# Patient Record
Sex: Male | Born: 1960
Health system: Southern US, Community
[De-identification: ages and names within clinical notes are randomized; demographics above are authoritative.]

## PROBLEM LIST (undated history)

## (undated) DIAGNOSIS — R001 Bradycardia, unspecified: Secondary | ICD-10-CM

## (undated) DIAGNOSIS — T7840XA Allergy, unspecified, initial encounter: Secondary | ICD-10-CM

## (undated) DIAGNOSIS — M1990 Unspecified osteoarthritis, unspecified site: Secondary | ICD-10-CM

## (undated) DIAGNOSIS — I10 Essential (primary) hypertension: Secondary | ICD-10-CM

## (undated) DIAGNOSIS — I82409 Acute embolism and thrombosis of unspecified deep veins of unspecified lower extremity: Secondary | ICD-10-CM

## (undated) DIAGNOSIS — E119 Type 2 diabetes mellitus without complications: Secondary | ICD-10-CM

## (undated) DIAGNOSIS — C61 Malignant neoplasm of prostate: Secondary | ICD-10-CM

## (undated) DIAGNOSIS — A219 Tularemia, unspecified: Secondary | ICD-10-CM

## (undated) DIAGNOSIS — R079 Chest pain, unspecified: Secondary | ICD-10-CM

## (undated) HISTORY — PX: ACHILLES TENDON REPAIR: SUR1153

## (undated) HISTORY — DX: Bradycardia, unspecified: R00.1

## (undated) HISTORY — PX: COLONOSCOPY W/ POLYPECTOMY: SHX1380

## (undated) HISTORY — PX: VASECTOMY: SHX75

## (undated) HISTORY — DX: Type 2 diabetes mellitus without complications: E11.9

## (undated) HISTORY — DX: Chest pain, unspecified: R07.9

## (undated) HISTORY — PX: PROSTATE BIOPSY: SHX241

## (undated) HISTORY — DX: Essential (primary) hypertension: I10

## (undated) HISTORY — DX: Allergy, unspecified, initial encounter: T78.40XA

## (undated) HISTORY — DX: Unspecified osteoarthritis, unspecified site: M19.90

## (undated) HISTORY — PX: CIRCUMCISION: SUR203

---

## 2012-11-12 ENCOUNTER — Encounter (HOSPITAL_BASED_OUTPATIENT_CLINIC_OR_DEPARTMENT_OTHER): Payer: Self-pay | Admitting: Emergency Medicine

## 2012-11-12 ENCOUNTER — Observation Stay (HOSPITAL_BASED_OUTPATIENT_CLINIC_OR_DEPARTMENT_OTHER)
Admission: EM | Admit: 2012-11-12 | Discharge: 2012-11-13 | Disposition: A | Discharge status: Home / Self Care | Payer: 59 | Attending: Internal Medicine | Admitting: Internal Medicine

## 2012-11-12 ENCOUNTER — Emergency Department (HOSPITAL_BASED_OUTPATIENT_CLINIC_OR_DEPARTMENT_OTHER): Payer: 59

## 2012-11-12 DIAGNOSIS — R0609 Other forms of dyspnea: Secondary | ICD-10-CM | POA: Insufficient documentation

## 2012-11-12 DIAGNOSIS — R079 Chest pain, unspecified: Secondary | ICD-10-CM | POA: Diagnosis present

## 2012-11-12 DIAGNOSIS — I10 Essential (primary) hypertension: Secondary | ICD-10-CM | POA: Insufficient documentation

## 2012-11-12 DIAGNOSIS — R001 Bradycardia, unspecified: Secondary | ICD-10-CM | POA: Diagnosis present

## 2012-11-12 DIAGNOSIS — I498 Other specified cardiac arrhythmias: Secondary | ICD-10-CM | POA: Insufficient documentation

## 2012-11-12 DIAGNOSIS — R0789 Other chest pain: Principal | ICD-10-CM | POA: Insufficient documentation

## 2012-11-12 DIAGNOSIS — R0989 Other specified symptoms and signs involving the circulatory and respiratory systems: Secondary | ICD-10-CM | POA: Insufficient documentation

## 2012-11-12 HISTORY — DX: Acute embolism and thrombosis of unspecified deep veins of unspecified lower extremity: I82.409

## 2012-11-12 HISTORY — DX: Tularemia, unspecified: A21.9

## 2012-11-12 LAB — CBC WITH DIFFERENTIAL/PLATELET
Eosinophils Absolute: 0.3 10*3/uL (ref 0.0–0.7)
Hemoglobin: 14.6 g/dL (ref 13.0–17.0)
Lymphocytes Relative: 39 % (ref 12–46)
Lymphs Abs: 2.9 10*3/uL (ref 0.7–4.0)
MCH: 29 pg (ref 26.0–34.0)
Monocytes Relative: 11 % (ref 3–12)
Neutrophils Relative %: 46 % (ref 43–77)
RBC: 5.04 MIL/uL (ref 4.22–5.81)
WBC: 7.6 10*3/uL (ref 4.0–10.5)

## 2012-11-12 LAB — COMPREHENSIVE METABOLIC PANEL
ALT: 31 U/L (ref 0–53)
Alkaline Phosphatase: 97 U/L (ref 39–117)
BUN: 16 mg/dL (ref 6–23)
CO2: 23 mEq/L (ref 19–32)
Chloride: 105 mEq/L (ref 96–112)
GFR calc Af Amer: 73 mL/min — ABNORMAL LOW (ref >90)
GFR calc non Af Amer: 63 mL/min — ABNORMAL LOW (ref >90)
Glucose, Bld: 103 mg/dL — ABNORMAL HIGH (ref 70–99)
Potassium: 3.8 mEq/L (ref 3.5–5.1)
Total Bilirubin: 0.3 mg/dL (ref 0.3–1.2)
Total Protein: 7.4 g/dL (ref 6.0–8.3)

## 2012-11-12 LAB — TROPONIN I: Troponin I: <0.3 ng/mL (ref <0.30)

## 2012-11-12 MED ORDER — KETOROLAC TROMETHAMINE 30 MG/ML IJ SOLN
30.0000 mg | Freq: Once | INTRAMUSCULAR | Status: AC
  Administered 2012-11-12: 30 mg via INTRAVENOUS
  Filled 2012-11-12: qty 1

## 2012-11-12 NOTE — ED Notes (Signed)
MD at bedside. 

## 2012-11-12 NOTE — ED Notes (Signed)
Pt having intermittent chest pain, some nausea, some lightheadedness, lethargy, sob on exertion for about 10 days.  Currently pain in at right side of chest.

## 2012-11-12 NOTE — ED Provider Notes (Signed)
History     CSN: [id number]  Arrival date & time 11/12/12  2248   First MD Initiated Contact with Patient 11/12/12 2311      Chief Complaint  Patient presents with  . Chest Pain    (Consider location/radiation/quality/duration/timing/severity/associated sxs/prior treatment) Patient is a 52 y.o. male presenting with chest pain. The history is provided by the patient.  Chest Pain Pain location:  L chest and R chest Pain quality: dull and tightness   Pain radiates to:  Does not radiate Pain radiates to the back: no   Pain severity:  Moderate Onset quality:  Gradual Timing:  Constant Progression:  Waxing and waning Chronicity:  New Context: not at rest   Relieved by:  Nothing Worsened by:  Nothing tried Ineffective treatments:  None tried Associated symptoms: cough, diaphoresis and nausea   Risk factors: no smoking     Past Medical History  Diagnosis Date  . [place] fever     No past surgical history on file.  No family history on file.  History  Substance Use Topics  . Smoking status: Never Smoker   . Smokeless tobacco: Not on file  . Alcohol Use: Not on file      Review of Systems  Constitutional: Positive for diaphoresis.  Respiratory: Positive for cough and wheezing.   Cardiovascular: Positive for chest pain.  Gastrointestinal: Positive for nausea.  All other systems reviewed and are negative.    Allergies  Review of patient's allergies indicates no known allergies.  Home Medications  No current outpatient prescriptions on file.  BP 137/86  Pulse 68  Temp(Src) 98.3 F (36.8 C) (Oral)  Resp 18  Ht 5\' 10"  (1.778 m)  Wt 245 lb (111.131 kg)  BMI 35.15 kg/m2  SpO2 97%  Physical Exam  Constitutional: He is oriented to person, place, and time. He appears well-developed and well-nourished. No distress.  HENT:  Head: Normocephalic and atraumatic.  Mouth/Throat: Oropharynx is clear and moist.  Eyes: Conjunctivae are normal. Pupils are  equal, round, and reactive to light.  Neck: Normal range of motion. Neck supple.  Cardiovascular: Normal rate, regular rhythm and intact distal pulses.   Pulmonary/Chest: Effort normal and breath sounds normal.  Abdominal: Soft. Bowel sounds are normal. There is no tenderness. There is no rebound and no guarding.  Musculoskeletal: Normal range of motion.  Neurological: He is alert and oriented to person, place, and time.  Skin: Skin is warm and dry.  Psychiatric: He has a normal mood and affect.    ED Course  Procedures (including critical care time)  Labs Reviewed  CBC WITH DIFFERENTIAL  COMPREHENSIVE METABOLIC PANEL  TROPONIN I   No results found.   No diagnosis found.    MDM   Date: 11/12/2012  Rate: 69  Rhythm: normal sinus rhythm  QRS Axis: normal  Intervals: normal  ST/T Wave abnormalities: normal  Conduction Disutrbances: none  Narrative Interpretation: unremarkable    .        Jasmine Awe, MD 11/13/12 [phone]

## 2012-11-13 ENCOUNTER — Encounter (HOSPITAL_COMMUNITY): Payer: Self-pay | Admitting: *Deleted

## 2012-11-13 ENCOUNTER — Other Ambulatory Visit: Payer: Self-pay | Admitting: Physician Assistant

## 2012-11-13 DIAGNOSIS — I498 Other specified cardiac arrhythmias: Secondary | ICD-10-CM

## 2012-11-13 DIAGNOSIS — R079 Chest pain, unspecified: Secondary | ICD-10-CM

## 2012-11-13 DIAGNOSIS — R001 Bradycardia, unspecified: Secondary | ICD-10-CM | POA: Diagnosis present

## 2012-11-13 LAB — CBC
HCT: 39.2 % (ref 39.0–52.0)
MCV: 81.5 fL (ref 78.0–100.0)
RBC: 4.81 MIL/uL (ref 4.22–5.81)
WBC: 6.4 10*3/uL (ref 4.0–10.5)

## 2012-11-13 LAB — TROPONIN I
Troponin I: <0.3 ng/mL (ref <0.30)
Troponin I: <0.3 ng/mL (ref <0.30)

## 2012-11-13 LAB — LIPID PANEL
Cholesterol: 143 mg/dL (ref 0–200)
Triglycerides: 108 mg/dL (ref <150)

## 2012-11-13 LAB — CREATININE, SERUM: GFR calc Af Amer: 74 mL/min — ABNORMAL LOW (ref >90)

## 2012-11-13 MED ORDER — ALBUTEROL SULFATE (5 MG/ML) 0.5% IN NEBU: 2.5000 mg | INHALATION_SOLUTION | RESPIRATORY_TRACT | Status: DC | PRN

## 2012-11-13 MED ORDER — SODIUM CHLORIDE 0.9 % IJ SOLN: 3.0000 mL | INTRAMUSCULAR | Status: DC | PRN

## 2012-11-13 MED ORDER — ACETAMINOPHEN 325 MG PO TABS: 650.0000 mg | ORAL_TABLET | Freq: Four times a day (QID) | ORAL | Status: DC | PRN

## 2012-11-13 MED ORDER — GUAIFENESIN ER 600 MG PO TB12
600.0000 mg | ORAL_TABLET | Freq: Two times a day (BID) | ORAL | Status: DC
Start: 2012-11-13 — End: 2012-11-13
  Filled 2012-11-13 (×2): qty 1

## 2012-11-13 MED ORDER — SODIUM CHLORIDE 0.9 % IV SOLN: 250.0000 mL | INTRAVENOUS | Status: DC | PRN

## 2012-11-13 MED ORDER — MORPHINE SULFATE 2 MG/ML IJ SOLN: 2.0000 mg | INTRAMUSCULAR | Status: DC | PRN

## 2012-11-13 MED ORDER — AMLODIPINE BESYLATE 2.5 MG PO TABS: 2.5000 mg | ORAL_TABLET | Freq: Every day | ORAL | Status: DC

## 2012-11-13 MED ORDER — DOCUSATE SODIUM 100 MG PO CAPS
100.0000 mg | ORAL_CAPSULE | Freq: Two times a day (BID) | ORAL | Status: DC
  Filled 2012-11-13 (×2): qty 1

## 2012-11-13 MED ORDER — HYDROCODONE-ACETAMINOPHEN 5-325 MG PO TABS: 1.0000 | ORAL_TABLET | ORAL | Status: DC | PRN

## 2012-11-13 MED ORDER — FENTANYL CITRATE 0.05 MG/ML IJ SOLN
100.0000 ug | Freq: Once | INTRAMUSCULAR | Status: AC
  Administered 2012-11-13: 100 ug via INTRAVENOUS
  Filled 2012-11-13: qty 2

## 2012-11-13 MED ORDER — ENOXAPARIN SODIUM 40 MG/0.4ML ~~LOC~~ SOLN
40.0000 mg | SUBCUTANEOUS | Status: DC
  Filled 2012-11-13: qty 0.4

## 2012-11-13 MED ORDER — ONDANSETRON HCL 4 MG PO TABS: 4.0000 mg | ORAL_TABLET | Freq: Four times a day (QID) | ORAL | Status: DC | PRN

## 2012-11-13 MED ORDER — GI COCKTAIL ~~LOC~~
ORAL | Status: AC
  Administered 2012-11-13: 30 mL via ORAL
  Filled 2012-11-13: qty 30

## 2012-11-13 MED ORDER — ALBUTEROL SULFATE HFA 108 (90 BASE) MCG/ACT IN AERS: 2.0000 | INHALATION_SPRAY | Freq: Four times a day (QID) | RESPIRATORY_TRACT | Status: DC | PRN

## 2012-11-13 MED ORDER — GI COCKTAIL ~~LOC~~: 30.0000 mL | Freq: Once | ORAL | Status: AC

## 2012-11-13 MED ORDER — ASPIRIN 81 MG PO TBEC: 81.0000 mg | DELAYED_RELEASE_TABLET | Freq: Every day | ORAL | Status: DC

## 2012-11-13 MED ORDER — AMLODIPINE BESYLATE 2.5 MG PO TABS
2.5000 mg | ORAL_TABLET | Freq: Every day | ORAL | Status: DC
  Filled 2012-11-13: qty 1

## 2012-11-13 MED ORDER — SODIUM CHLORIDE 0.9 % IJ SOLN
3.0000 mL | Freq: Two times a day (BID) | INTRAMUSCULAR | Status: DC
Start: 2012-11-13 — End: 2012-11-13

## 2012-11-13 MED ORDER — SODIUM CHLORIDE 0.9 % IJ SOLN: 3.0000 mL | Freq: Two times a day (BID) | INTRAMUSCULAR | Status: DC

## 2012-11-13 MED ORDER — ACETAMINOPHEN 650 MG RE SUPP: 650.0000 mg | Freq: Four times a day (QID) | RECTAL | Status: DC | PRN

## 2012-11-13 MED ORDER — ASPIRIN EC 81 MG PO TBEC
81.0000 mg | DELAYED_RELEASE_TABLET | Freq: Every day | ORAL | Status: DC
Start: 2012-11-13 — End: 2012-11-13
  Filled 2012-11-13: qty 1

## 2012-11-13 MED ORDER — ONDANSETRON HCL 4 MG/2ML IJ SOLN: 4.0000 mg | Freq: Four times a day (QID) | INTRAMUSCULAR | Status: DC | PRN

## 2012-11-13 MED ORDER — GUAIFENESIN ER 600 MG PO TB12: 600.0000 mg | ORAL_TABLET | Freq: Two times a day (BID) | ORAL | Status: DC

## 2012-11-13 NOTE — Progress Notes (Signed)
Patient ID: Adam Estrada  male  EAV:[id number]    DOB: Mar 09, 1961    DOA: 11/12/2012  PCP: No primary provider on file.  Assessment/Plan:    Chest pain: With typical and atypical features -No other risk factors, HEART score 2, troponins negative, EKG showed no acute ST-T wave changes, no prior cardiac workup - Discussed with the Dr. Antoine Poche from [facility] cardiology, recommended exercise tolerance test    Bradycardia - Currently stable not on any beta blockers  DVT Prophylaxis:  Code Status:  Disposition: if exercise tolerance test is negative, patient can be DC'd home and obtain outpatient 2-D echocardiogram at [facility] cardiology office    Subjective: Chest pain resolved, no dizziness or lightheadedness  Objective: Weight change:   Intake/Output Summary (Last 24 hours) at 11/13/12 1303 Last data filed at 11/13/12 0515  Gross per 24 hour  Intake      0 ml  Output    250 ml  Net   -250 ml   Blood pressure 103/59, pulse 46, temperature 97.7 F (36.5 C), temperature source Oral, resp. rate 18, height 5\' 10"  (1.778 m), weight 111.131 kg (245 lb), SpO2 100.00%.  Physical Exam: General: Alert and awake, oriented x3, not in any acute distress. HEENT: anicteric sclera, PERLA, EOMI CVS: S1-S2 clear, no murmur rubs or gallops Chest: clear to auscultation bilaterally, no wheezing, rales or rhonchi Abdomen: soft nontender, nondistended, normal bowel sounds  Extremities: no cyanosis, clubbing or edema noted bilaterally Neuro: Cranial nerves II-XII intact, no focal neurological deficits  Lab Results: Basic Metabolic Panel:  Recent Labs Lab 11/12/12 2313 11/13/12 0657  NA 139  --   K 3.8  --   CL 105  --   CO2 23  --   GLUCOSE 103*  --   BUN 16  --   CREATININE 1.30 1.28  CALCIUM 9.7  --    Liver Function Tests:  Recent Labs Lab 11/12/12 2313  AST 21  ALT 31  ALKPHOS 97  BILITOT 0.3  PROT 7.4  ALBUMIN 3.8   No results found for this basename: LIPASE, AMYLASE,   in the last 168 hours No results found for this basename: AMMONIA,  in the last 168 hours CBC:  Recent Labs Lab 11/12/12 2313 11/13/12 0657  WBC 7.6 6.4  NEUTROABS 3.5  --   HGB 14.6 13.9  HCT 40.8 39.2  MCV 81.0 81.5  PLT 244 212   Cardiac Enzymes:  Recent Labs Lab 11/12/12 2313 11/13/12 0657 11/13/12 1140  TROPONINI <0.30 <0.30 <0.30   BNP: No components found with this basename: POCBNP,  CBG: No results found for this basename: GLUCAP,  in the last 168 hours   Micro Results: No results found for this or any previous visit (from the past 240 hour(s)).  Studies/Results: Dg Chest 2 View  11/13/2012   *RADIOLOGY REPORT*  Clinical Data: Chest pain and shortness of breath with exertion  CHEST - 2 VIEW  Comparison: None.  Findings: Patient is slightly rotated to the left, accentuating the right hilar contour.  Normal heart size. Normal pulmonary vascularity.  The lungs are clear aside from minimal linear left atelectasis or scarring at the lateral left lung base.  Bones unremarkable.  IMPRESSION: No acute cardiopulmonary disease.  Lungs clear except for minimal left basilar atelectasis or scar.   Original Report Authenticated By: Britta Mccreedy, M.D.    Medications: Scheduled Meds: . aspirin EC  81 mg Oral Daily  . docusate sodium  100 mg Oral BID  .  enoxaparin (LOVENOX) injection  40 mg Subcutaneous Q24H  . guaiFENesin  600 mg Oral BID  . sodium chloride  3 mL Intravenous Q12H  . sodium chloride  3 mL Intravenous Q12H      LOS: 1 day   RAI,RIPUDEEP M.D. [facility] 11/13/2012, 1:03 PM Pager: [phone]  If 7PM-7AM, please contact night-coverage [email or web address] Password [email or web address]

## 2012-11-13 NOTE — H&P (Signed)
PCP: none   Chief Complaint:  Chest pain  HPI: Adam Estrada is a 52 y.o. male   has a past medical history of [place] fever and DVT (deep venous thrombosis).   Presented with  Patient reports feeling fatigued with off and on sensation of tightness in his chest and some pain. It seems to be worse with exertion. He occasionally gets short of breath. He walks a lot for work and when he does he feel uncomfortable. Never had a stress test done. The tightness in his chest seems to be constant for the past 1 week and does not go away. He has occasional painful sensation that comes and goes and can radiate to the back.  patient presented to [facility] and was transferred to [facility] for further work up. Of note patient is Jehovah's witness and does not wish to receive any blood products.   Review of Systems:   Pertinent positives include: chest pain, non-productive cough, nausea,   Constitutional:  No weight loss, night sweats, Fevers, chills, fatigue, weight loss  HEENT:  No headaches, Difficulty swallowing,Tooth/dental problems,Sore throat,  No sneezing, itching, ear ache, nasal congestion, post nasal drip,  Cardio-vascular:  No , Orthopnea, PND, anasarca, dizziness, palpitations.no Bilateral lower extremity swelling  GI:  No heartburn, indigestion, abdominal pain, vomiting, diarrhea, change in bowel habits, loss of appetite, melena, blood in stool, hematemesis Resp:  no shortness of breath at rest. No dyspnea on exertion, No excess mucus, no productive cough, No No coughing up of blood.No change in color of mucus.No wheezing. Skin:  no rash or lesions. No jaundice GU:  no dysuria, change in color of urine, no urgency or frequency. No straining to urinate.  No flank pain.  Musculoskeletal:  No joint pain or no joint swelling. No decreased range of motion. No back pain.  Psych:  No change in mood or affect. No depression or anxiety. No memory loss.  Neuro: no localizing neurological  complaints, no tingling, no weakness, no double vision, no gait abnormality, no slurred speech, no confusion  Otherwise ROS are negative except for above, 10 systems were reviewed  Past Medical History: Past Medical History  Diagnosis Date  . [place] fever   . DVT (deep venous thrombosis)    Past Surgical History  Procedure Laterality Date  . Achilles tendon repair       Medications: Prior to Admission medications   Not on File    Allergies:  No Known Allergies  Social History:  Ambulatory   independently   Lives at   Home with family   reports that he has never smoked. He does not have any smokeless tobacco history on file. He reports that he does not drink alcohol or use illicit drugs.   Family History: family history includes Breast cancer in his maternal aunt; Heart disease in his maternal aunt and other; and Pneumonia in his father.    Physical Exam: Patient Vitals for the past 24 hrs:  BP Temp Temp src Pulse Resp SpO2 Height Weight  11/13/12 0500 103/59 mmHg 97.7 F (36.5 C) Oral 46 18 100 % - -  11/13/12 0334 114/63 mmHg - - 62 18 100 % - -  11/13/12 0125 110/62 mmHg - - 58 18 100 % - -  11/12/12 2255 137/86 mmHg 98.3 F (36.8 C) Oral 68 18 97 % 5\' 10"  (1.778 m) 111.131 kg (245 lb)    1. General:  in No Acute distress 2. Psychological: Alert and Oriented 3. Head/ENT:  Moist   Mucous Membranes                          Head Non traumatic, neck supple                          Normal   Dentition 4. SKIN: normal   Skin turgor,  Skin clean Dry and intact no rash 5. Heart: Regular rate and rhythm no Murmur, Rub or gallop 6. Lungs: Clear to auscultation bilaterally, no wheezes or crackles   7. Abdomen: Soft, non-tender, Non distended 8. Lower extremities: no clubbing, cyanosis, or edema 9. Neurologically Grossly intact, moving all 4 extremities equally 10. MSK: Normal range of motion  body mass index is 35.15 kg/(m^2).   Labs on Admission:    Recent Labs  11/12/12 2313  NA 139  K 3.8  CL 105  CO2 23  GLUCOSE 103*  BUN 16  CREATININE 1.30  CALCIUM 9.7    Recent Labs  11/12/12 2313  AST 21  ALT 31  ALKPHOS 97  BILITOT 0.3  PROT 7.4  ALBUMIN 3.8   No results found for this basename: LIPASE, AMYLASE,  in the last 72 hours  Recent Labs  11/12/12 2313  WBC 7.6  NEUTROABS 3.5  HGB 14.6  HCT 40.8  MCV 81.0  PLT 244    Recent Labs  11/12/12 2313  TROPONINI <0.30   No results found for this basename: TSH, T4TOTAL, FREET3, T3FREE, THYROIDAB,  in the last 72 hours No results found for this basename: VITAMINB12, FOLATE, FERRITIN, TIBC, IRON, RETICCTPCT,  in the last 72 hours No results found for this basename: HGBA1C    Estimated Creatinine Clearance: 85.8 ml/min (by C-G formula based on Cr of 1.3). ABG No results found for this basename: phart, pco2, po2, hco3, tco2, acidbasedef, o2sat     Lab Results  Component Value Date   DDIMER <0.27 11/12/2012     Other results:  I have pearsonaly reviewed this: ECG REPORT  Rate: 69  Rhythm: NSR ST&T Change: V2 changes likely due to early repolarization  Cultures: No results found for this basename: sdes, specrequest, cult, reptstatus       Radiological Exams on Admission: Dg Chest 2 View  11/13/2012   *RADIOLOGY REPORT*  Clinical Data: Chest pain and shortness of breath with exertion  CHEST - 2 VIEW  Comparison: None.  Findings: Patient is slightly rotated to the left, accentuating the right hilar contour.  Normal heart size. Normal pulmonary vascularity.  The lungs are clear aside from minimal linear left atelectasis or scarring at the lateral left lung base.  Bones unremarkable.  IMPRESSION: No acute cardiopulmonary disease.  Lungs clear except for minimal left basilar atelectasis or scar.   Original Report Authenticated By: Britta Mccreedy, M.D.    Chart has been reviewed  Assessment/Plan   52 yo M w no prior cardiac hx here with atypical chest  pain but somewhat worrisome with exertional component  Present on Admission:  . Chest pain - atypical but given risk factors will admit, monitor on telemetry, cycle cardiac enzymes, obtain serial ECG. Further risk stratify with lipid panel, hgA1C, obtain TSH. Make sure patient is on Aspirin. Further treatment based on the currently pending results. Will obtain echo to evaluate for wall motion abnormality and or hypertrophy . Bradycardia - check TSH, asymtomatic   Prophylaxis: Lovenox   CODE STATUS: FULL CODE  Other plan  as per orders.  I have spent a total of 55 min on this admission  Mande Auvil 11/13/2012, 5:49 AM

## 2012-11-13 NOTE — Discharge Summary (Signed)
Physician Discharge Summary  Patient ID: Adam Estrada MRN: [id number] DOB/AGE: 07/03/1960 52 y.o.  Admit date: 11/12/2012 Discharge date: 11/13/2012  Primary Care Physician:  No primary provider on file.  Discharge Diagnoses:    . Chest pain . Bradycardia   Dyspnea on exertion  Consults: [facility] cardiology   Recommendations for Outpatient Follow-up:  - Outpatient 2-D echo was scheduled for tomorrow 11/14/2012 at 4 PM - Followup cardiology appt 01/07/13 at 10:15am with Dr. Swaziland  - Patient was also provided with resources/phone number to call for obtaining primary care physician by the case manager   Allergies:  No Known Allergies   Discharge Medications:   Medication List    TAKE these medications       albuterol 108 (90 BASE) MCG/ACT inhaler  Commonly known as:  PROVENTIL HFA;VENTOLIN HFA  Inhale 2 puffs into the lungs every 6 (six) hours as needed for wheezing.     amLODipine 2.5 MG tablet  Commonly known as:  NORVASC  Take 1 tablet (2.5 mg total) by mouth daily.     aspirin 81 MG EC tablet  Take 1 tablet (81 mg total) by mouth daily.     guaiFENesin 600 MG 12 hr tablet  Commonly known as:  MUCINEX  Take 1 tablet (600 mg total) by mouth 2 (two) times daily.         Brief H and P: For complete details please refer to admission H and P, but in brief 52 year old male with history of DVT, reported feeling fatigued with off and on sensation of tightness in his chest and some pain. It seems to be worse with exertion. He occasionally gets short of breath. He walks a lot for work and when he does he feel uncomfortable. Never had a stress test done. The tightness in his chest seems to be constant for the past 1 week and does not go away. He has occasional painful sensation that comes and goes and can radiate to the back.  patient presented to [facility] and was transferred to [facility] for further work up.   Hospital Course:   Chest pain: With typical and atypical features. No  other risk factors, HEART score 2, troponins negative, EKG showed no acute ST-T wave changes, no prior cardiac workup. Discussed with the Dr. Antoine Poche from [facility] cardiology. Patient underwent exercise tolerance test.  Per cardiology,  Exercised 8:50, reached target HR of 146, max 148. Hypertensive response to exercise with BP up to 231/82 thus test terminated. SOB present at very end of test but overall very good exercise tolerance. In retrospect patient calls mild chest tightness (not pain) towards end of test, but did not report this upon questioning during the test. Tracings read by Dr. Patty Sermons - Baseline wander present but no acute ischemic changes, essentially a normal study.  Cardiology recommended starting patient on low dose of Norvasc for exertional hypertension. Outpatient 2-D echo was arranged for tomorrow 11/14/2012 at [facility] cardiology office. Patient will followup with Dr. Swaziland with his outpatient appointment.  Resting Sinus Bradycardia - Currently stable not on any beta blockers, no AV blocks on the EKG.   Day of Discharge BP 103/59  Pulse 46  Temp(Src) 97.7 F (36.5 C) (Oral)  Resp 18  Ht 5\' 10"  (1.778 m)  Wt 111.131 kg (245 lb)  BMI 35.15 kg/m2  SpO2 100%  Physical Exam: General: Alert and awake oriented x3 not in any acute distress. HEENT: anicteric sclera, pupils reactive to light and accommodation CVS: S1-S2 clear no  murmur rubs or gallops Chest: clear to auscultation bilaterally, no wheezing rales or rhonchi Abdomen: soft nontender, nondistended, normal bowel sounds, no organomegaly Extremities: no cyanosis, clubbing or edema noted bilaterally Neuro: Cranial nerves II-XII intact, no focal neurological deficits   The results of significant diagnostics from this hospitalization (including imaging, microbiology, ancillary and laboratory) are listed below for reference.    LAB RESULTS: Basic Metabolic Panel:  Recent Labs Lab 11/12/12 2313 11/13/12 0657   NA 139  --   K 3.8  --   CL 105  --   CO2 23  --   GLUCOSE 103*  --   BUN 16  --   CREATININE 1.30 1.28  CALCIUM 9.7  --    Liver Function Tests:  Recent Labs Lab 11/12/12 2313  AST 21  ALT 31  ALKPHOS 97  BILITOT 0.3  PROT 7.4  ALBUMIN 3.8   No results found for this basename: LIPASE, AMYLASE,  in the last 168 hours No results found for this basename: AMMONIA,  in the last 168 hours CBC:  Recent Labs Lab 11/12/12 2313 11/13/12 0657  WBC 7.6 6.4  NEUTROABS 3.5  --   HGB 14.6 13.9  HCT 40.8 39.2  MCV 81.0 81.5  PLT 244 212   Cardiac Enzymes:  Recent Labs Lab 11/13/12 0657 11/13/12 1140  TROPONINI <0.30 <0.30   BNP: No components found with this basename: POCBNP,  CBG: No results found for this basename: GLUCAP,  in the last 168 hours  Significant Diagnostic Studies:  Dg Chest 2 View  11/13/2012   *RADIOLOGY REPORT*  Clinical Data: Chest pain and shortness of breath with exertion  CHEST - 2 VIEW  Comparison: None.  Findings: Patient is slightly rotated to the left, accentuating the right hilar contour.  Normal heart size. Normal pulmonary vascularity.  The lungs are clear aside from minimal linear left atelectasis or scarring at the lateral left lung base.  Bones unremarkable.  IMPRESSION: No acute cardiopulmonary disease.  Lungs clear except for minimal left basilar atelectasis or scar.   Original Report Authenticated By: Britta Mccreedy, M.D.    Disposition and Follow-up:     Discharge Orders   Future Appointments Provider Department Dept Phone   11/14/2012 4:00 PM [facility] Echo [facility] SITE 3 ECHO LAB [phone]   01/07/2013 10:15 AM Peter M Swaziland, MD [facility] Main Office [place]) [phone]   Future Orders Complete By Expires     Diet - low sodium heart healthy  As directed     Increase activity slowly  As directed         DISPOSITION: Home   DIET: Heart healthy diet ACTIVITY: As tolerated   DISCHARGE  FOLLOW-UP Follow-up Information   Follow up with [facility]. (Heart ultrasound tomorrow 11/14/12 at 4pm)    Contact information:   [address] [place] [address] [phone]      Follow up with Peter Swaziland, MD. (01/07/13 at 10:15am - you were also put on a waiting list for a sooner appointment. Return to the ER for any symptoms concerning to you.)    Contact information:   [address] [place] [address] [phone] [facility]      Time spent on Discharge: 35 mins  Signed:   Salem Lembke M.D. [facility] 11/13/2012, 1:39 PM Pager: [phone]

## 2012-11-13 NOTE — Care Management (Signed)
Care Manager provided pt with the [facility] Number for local PCP. No further needs from CM at this time. Gala Lewandowsky, RN,BSN [phone]

## 2012-11-13 NOTE — Progress Notes (Signed)
ETT completed. Exercised 8:50, reached target HR of 146, max 148.  Hypertensive response to exercise with BP up to 231/82 thus test terminated. SOB present at very end of test but overall very good exercise tolerance. In retrospect patient calls mild chest tightness (not pain) towards end of test, but did not report this upon questioning during the test. Tracings read by Dr. Patty Sermons - Baseline wander present but no acute ischemic changes, essentially a normal study. Per our discussion, might consider low dose antihypertensive given hypertensive response (does not have resting hypertension). Per discussion with Dr. Isidoro Donning, will set up appt for echo (tomorrow 6/20 at 4pm) and f/u in our office (next available new pt appointment 01/07/13 at 10:15am with Dr. Swaziland but is on a waiting list to be moved up to a sooner date). He should be given return-to-ER precautions at discharge.  Dayna Dunn PA-C

## 2012-11-14 ENCOUNTER — Ambulatory Visit (HOSPITAL_COMMUNITY): Payer: 59 | Attending: Cardiology | Admitting: Radiology

## 2012-11-14 DIAGNOSIS — I495 Sick sinus syndrome: Secondary | ICD-10-CM | POA: Insufficient documentation

## 2012-11-14 DIAGNOSIS — R079 Chest pain, unspecified: Secondary | ICD-10-CM | POA: Insufficient documentation

## 2012-11-14 DIAGNOSIS — R072 Precordial pain: Secondary | ICD-10-CM | POA: Insufficient documentation

## 2012-11-14 NOTE — Progress Notes (Signed)
Echocardiogram performed.  

## 2013-01-07 ENCOUNTER — Ambulatory Visit (INDEPENDENT_AMBULATORY_CARE_PROVIDER_SITE_OTHER): Payer: 59 | Admitting: Cardiology

## 2013-01-07 ENCOUNTER — Encounter: Payer: Self-pay | Admitting: Cardiology

## 2013-01-07 VITALS — BP 132/84 | HR 74 | Ht 70.0 in | Wt 267.8 lb

## 2013-01-07 DIAGNOSIS — R079 Chest pain, unspecified: Secondary | ICD-10-CM

## 2013-01-07 DIAGNOSIS — I498 Other specified cardiac arrhythmias: Secondary | ICD-10-CM

## 2013-01-07 DIAGNOSIS — R001 Bradycardia, unspecified: Secondary | ICD-10-CM

## 2013-01-07 NOTE — Progress Notes (Signed)
   Adam Estrada Date of Birth: [birth year] Medical Record #[id number]  History of Present Illness: Adam Estrada is seen for followup today following hospitalization in June for her chest pain. He ruled out for myocardial infarction. A stress Myoview was performed. He was able to complete stage III of the Bruce protocol. He had no significant ST changes and his Myoview study was normal. He did have a hypertensive blood pressure response to exercise. Echocardiogram was normal. He was discharged on amlodipine but states he only took it for 2 days and then stopped it. He has been feeling well. He denies any recurrent chest pain. His exercise includes playing basketball one day a week. He is in the process of finding a primary care doctor.  No current outpatient prescriptions on file prior to visit.   No current facility-administered medications on file prior to visit.    No Known Allergies  Past Medical History  Diagnosis Date  . [place] fever   . DVT (deep venous thrombosis)   . Chest pain   . HTN (hypertension)   . Bradycardia     Past Surgical History  Procedure Laterality Date  . Achilles tendon repair      History  Smoking status  . Never Smoker   Smokeless tobacco  . Not on file    History  Alcohol Use No    Family History  Problem Relation Age of Onset  . Pneumonia Father   . Heart disease Other   . Heart disease Maternal Aunt   . Breast cancer Maternal Aunt     Review of Systems: As noted in history of present illnesse negative.  Physical Exam: BP 132/84  Pulse 74  Ht 5\' 10"  (1.778 m)  Wt 267 lb 12.8 oz (121.473 kg)  BMI 38.43 kg/m2  SpO2 99%  he is an overweight black male in no acute distress. HEENT: Normal, no JVD, adenopathy, or bruits. Lungs: Clear Cardiovascular: Regular rate and rhythm, normal S1 and S2. No gallop or murmur. Abdomen: Obese, soft, nontender. Bowel sounds are positive. Extremities: No cyanosis or edema. Pulses are 2+ and  symmetric. Neuro: Alert and oriented x3. Cranial nerves are intact.  LABORATORY DATA:   Assessment / Plan:  1. Atypical chest pain. Cardiac evaluation is unremarkable. I recommended lifestyle modifications including weight loss, regular aerobic exercise, and a heart healthy diet. His blood pressure is normal. I do not recommend long-term antihypertensive therapy at this point. I will followup as needed.

## 2013-01-07 NOTE — Patient Instructions (Signed)
Increase your aerobic activity, eat a healthy diet, and lose weight.  I will see you as needed.

## 2014-01-28 ENCOUNTER — Ambulatory Visit: Payer: 59 | Admitting: Medical

## 2014-05-17 ENCOUNTER — Ambulatory Visit (INDEPENDENT_AMBULATORY_CARE_PROVIDER_SITE_OTHER): Payer: 59 | Admitting: Family Medicine

## 2014-05-17 ENCOUNTER — Encounter: Payer: Self-pay | Admitting: Family Medicine

## 2014-05-17 VITALS — BP 130/68 | HR 75 | Temp 98.3°F | Resp 16 | Ht 70.0 in | Wt 264.8 lb

## 2014-05-17 DIAGNOSIS — Z131 Encounter for screening for diabetes mellitus: Secondary | ICD-10-CM

## 2014-05-17 DIAGNOSIS — Z130 Encounter for screening for diseases of the blood and blood-forming organs and certain disorders involving the immune mechanism: Secondary | ICD-10-CM

## 2014-05-17 DIAGNOSIS — Z0000 Encounter for general adult medical examination without abnormal findings: Secondary | ICD-10-CM

## 2014-05-17 DIAGNOSIS — Z1211 Encounter for screening for malignant neoplasm of colon: Secondary | ICD-10-CM

## 2014-05-17 DIAGNOSIS — Z13220 Encounter for screening for lipoid disorders: Secondary | ICD-10-CM

## 2014-05-17 DIAGNOSIS — N529 Male erectile dysfunction, unspecified: Secondary | ICD-10-CM

## 2014-05-17 DIAGNOSIS — Z125 Encounter for screening for malignant neoplasm of prostate: Secondary | ICD-10-CM

## 2014-05-17 MED ORDER — SILDENAFIL CITRATE 100 MG PO TABS: 50.0000 mg | ORAL_TABLET | Freq: Every day | ORAL | Status: DC | PRN

## 2014-05-17 NOTE — Progress Notes (Signed)
Subjective:    Patient ID: Adam Ice., male    DOB: [birth year], 53 y.o.   MRN: [id number]  PCP: No primary care provider on file.  Chief Complaint  Patient presents with  . estab care  . age appropiate testing, PSA, Colonoscopy   Patient Active Problem List   Diagnosis Date Noted  . Chest pain 11/13/2012  . Bradycardia 11/13/2012   Prior to Admission medications   Medication Sig Start Date End Date Taking? Authorizing Provider  sildenafil (VIAGRA) 100 MG tablet Take 0.5-1 tablets (50-100 mg total) by mouth daily as needed for erectile dysfunction. 05/17/14   Araceli Bouche, PA   Medications, allergies, past medical history, surgical history, family history, social history and problem list reviewed and updated.  HPI  53 yom with no significant pmh presents to clinic today for cpe and to establish care.  He had atypical cp 1.5 yrs ago and had a neg stress test at that time. He denies any cp since. He was started on amlodipine at that time but only took for 2 days as he didn't feel he needed it.   He is unsure what his bp typically runs. He thinks he has a cuff at home but doesn't check it as he doesn't know the numbers he should be shooting for.   He is interested in a cpe today. He denies any issues or complaints other than a uri for the past week and occasional aches and pains bilateral knees and shoulders. They only bother him with certain activities, but still feels he is able to accomplish his daily tasks. He has never taken any otc pain medication.  He would like to get referred for a colonoscopy. He would like to have a prostate exam today. He got flu vaccine this year through work and tdap vaccine 2 yrs ago.   Mentions that his wife encouraged him to come in as he has been having erectile issues for several yrs. He thinks his ED stretches back as far as 10 yrs. He has taken viagra before and has worked for him but that has been several yrs. He is able to have an  erection just not his normal. He can only sustain the erection for several mins at a time. He denies any emotional or intimacy issues with his spouse.   Review of Systems No chest pain, SOB, HA, dizziness, vision change, N/V, diarrhea, constipation, dysuria, urinary urgency or frequency, or rash. As above uri sx past week and bilateral occasional knee and shoulder pain with activity.       Objective:   Physical Exam  Constitutional: He is oriented to person, place, and time. He appears well-developed and well-nourished.  Non-toxic appearance. He does not have a sickly appearance. He does not appear ill. No distress.  BP 130/68 mmHg  Pulse 75  Temp(Src) 98.3 F (36.8 C) (Oral)  Resp 16  Ht 5\' 10"  (1.778 m)  Wt 264 lb 12.8 oz (120.112 kg)  BMI 37.99 kg/m2  SpO2 99%   HENT:  Right Ear: Tympanic membrane and ear canal normal.  Left Ear: Tympanic membrane and ear canal normal.  Nose: Nose normal. Right sinus exhibits no maxillary sinus tenderness and no frontal sinus tenderness. Left sinus exhibits no maxillary sinus tenderness and no frontal sinus tenderness.  Mouth/Throat: Uvula is midline and mucous membranes are normal. No oropharyngeal exudate.  Eyes: Conjunctivae and EOM are normal. Pupils are equal, round, and reactive to light.  Neck: Trachea normal and normal  range of motion. Neck supple. No thyroid mass and no thyromegaly present.  Cardiovascular: Normal rate, regular rhythm, S1 normal, S2 normal, normal heart sounds and normal pulses.  PMI is not displaced.  Exam reveals no gallop.   No murmur heard. Pulmonary/Chest: Effort normal and breath sounds normal. He has no decreased breath sounds. He has no wheezes. He has no rhonchi. He has no rales.  Abdominal: Soft. Normal appearance and bowel sounds are normal. There is no tenderness. There is no CVA tenderness.  Genitourinary: Rectum normal and prostate normal. Prostate is not enlarged and not tender.  Musculoskeletal:       Right  knee: Normal.       Left knee: Normal.  Lymphadenopathy:       Head (right side): No submental, no submandibular and no tonsillar adenopathy present.       Head (left side): No submental, no submandibular and no tonsillar adenopathy present.    He has no cervical adenopathy.  Neurological: He is alert and oriented to person, place, and time. He has normal strength. No cranial nerve deficit or sensory deficit.  Skin: Skin is warm and dry. No rash noted.  6-8 skin tags left neck.   Psychiatric: He has a normal mood and affect. His speech is normal and behavior is normal.      Assessment & Plan:   53 yom with no significant pmh presents to clinic today for cpe and to establish care.  Annual physical exam --normal vitals and exam today --labs, preventative maintenance as below --rtc for cpe or sooner as issues arise  Erectile dysfunction, unspecified erectile dysfunction type - Plan: sildenafil (VIAGRA) 100 MG tablet, Testosterone --viagra prn --testosterone  Screening for hyperlipidemia - Plan: Lipid panel  Screening for deficiency anemia - Plan: CBC  Screening for prostate cancer - Plan: PSA  Screening for diabetes mellitus - Plan: Comprehensive metabolic panel  Colon cancer screening - Plan: Ambulatory referral to Gastroenterology   Julieta Gutting, PA-C Physician Assistant-Certified [facility]  05/17/2014 8:28 PM

## 2014-05-17 NOTE — Patient Instructions (Signed)
Thank you for coming in for your physical exam today, everything looked great.  Come back in one morning while fasting and we'll draw your labs at that time. I'll contact you about one week after that to let you know the results.  I've referred you for a colonoscopy, they'll be in touch with you.  I've sent the viagra into the pharmacy, remember this med can drop your blood pressure. Also remember it has a rare side effect of hearing loss that you should be aware of.  Please come back in one year or sooner if you have issues or need the skin tags removed.   Keeping you healthy  Get these tests  Blood pressure- Have your blood pressure checked once a year by your healthcare provider.  Normal blood pressure is 120/80  Weight- Have your body mass index (BMI) calculated to screen for obesity.  BMI is a measure of body fat based on height and weight. You can also calculate your own BMI at ViewBanking.si.  Cholesterol- Have your cholesterol checked every year.  Diabetes- Have your blood sugar checked regularly if you have high blood pressure, high cholesterol, have a family history of diabetes or if you are overweight.  Screening for Colon Cancer- Colonoscopy starting at age 38.  Screening may begin sooner depending on your family history and other health conditions. Follow up colonoscopy as directed by your Gastroenterologist.  Screening for Prostate Cancer- Both blood work (PSA) and a rectal exam help screen for Prostate Cancer.  Screening begins at age 51 with African-American men and at age 80 with Caucasian men.  Screening may begin sooner depending on your family history.  Take these medicines  Aspirin- One aspirin daily can help prevent Heart disease and Stroke.  Flu shot- Every fall.  Tetanus- Every 10 years.  Zostavax- Once after the age of 53 to prevent Shingles.  Pneumonia shot- Once after the age of 68; if you are younger than 73, ask your healthcare provider if you  need a Pneumonia shot.  Take these steps  Don't smoke- If you do smoke, talk to your doctor about quitting.  For tips on how to quit, go to www.smokefree.gov or call 1-800-QUIT-NOW.  Be physically active- Exercise 5 days a week for at least 30 minutes.  If you are not already physically active start slow and gradually work up to 30 minutes of moderate physical activity.  Examples of moderate activity include walking briskly, mowing the yard, dancing, swimming, bicycling, etc.  Eat a healthy diet- Eat a variety of healthy food such as fruits, vegetables, low fat milk, low fat cheese, yogurt, lean meant, poultry, fish, beans, tofu, etc. For more information go to www.thenutritionsource.org  Drink alcohol in moderation- Limit alcohol intake to less than two drinks a day. Never drink and drive.  Dentist- Brush and floss twice daily; visit your dentist twice a year.  Depression- Your emotional health is as important as your physical health. If you're feeling down, or losing interest in things you would normally enjoy please talk to your healthcare provider.  Eye exam- Visit your eye doctor every year.  Safe sex- If you may be exposed to a sexually transmitted infection, use a condom.  Seat belts- Seat belts can save your life; always wear one.  Smoke/Carbon Monoxide detectors- These detectors need to be installed on the appropriate level of your home.  Replace batteries at least once a year.  Skin cancer- When out in the sun, cover up and use sunscreen  15 SPF or higher.  Violence- If anyone is threatening you, please tell your healthcare provider.  Living Will/ Health care power of attorney- Speak with your healthcare provider and family.

## 2014-05-17 NOTE — Progress Notes (Signed)
   Subjective:    Patient ID: Adam Ice., male    DOB: 21-Mar-1961, 53 y.o.   MRN: [id number]  HPI    Review of Systems  Constitutional: Negative.   HENT: Positive for congestion, postnasal drip, sneezing and voice change.   Eyes: Negative.   Respiratory: Positive for cough.   Cardiovascular: Negative.   Gastrointestinal: Negative.   Endocrine: Negative.   Genitourinary: Negative.   Musculoskeletal: Positive for myalgias, back pain, arthralgias and neck stiffness.  Skin: Negative.   Allergic/Immunologic: Positive for environmental allergies.  Neurological: Negative.   Hematological: Negative.   Psychiatric/Behavioral: Negative.        Objective:   Physical Exam        Assessment & Plan:

## 2014-05-18 ENCOUNTER — Encounter: Payer: Self-pay | Admitting: Gastroenterology

## 2014-05-20 NOTE — Progress Notes (Signed)
Patient discussed and examined with Mr. Rosanne Sack. Agree with assessment and plan of care per his note.

## 2014-07-02 ENCOUNTER — Ambulatory Visit (AMBULATORY_SURGERY_CENTER): Payer: Self-pay

## 2014-07-02 VITALS — Ht 70.0 in | Wt 264.0 lb

## 2014-07-02 DIAGNOSIS — Z1211 Encounter for screening for malignant neoplasm of colon: Secondary | ICD-10-CM

## 2014-07-02 MED ORDER — MOVIPREP 100 G PO SOLR: 1.0000 | Freq: Once | ORAL | Status: DC

## 2014-07-02 NOTE — Progress Notes (Signed)
No allergies to eggs or soy No past problems with anesthesia No home oxygen No diet/weight loss meds  Has email  Emmi instructions given for colonoscopy 

## 2014-07-16 ENCOUNTER — Ambulatory Visit (AMBULATORY_SURGERY_CENTER): Payer: 59 | Admitting: Gastroenterology

## 2014-07-16 ENCOUNTER — Encounter: Payer: Self-pay | Admitting: Gastroenterology

## 2014-07-16 VITALS — BP 115/62 | HR 56 | Temp 97.4°F | Resp 19 | Ht 70.0 in | Wt 264.0 lb

## 2014-07-16 DIAGNOSIS — D120 Benign neoplasm of cecum: Secondary | ICD-10-CM

## 2014-07-16 DIAGNOSIS — Z1211 Encounter for screening for malignant neoplasm of colon: Secondary | ICD-10-CM

## 2014-07-16 DIAGNOSIS — D123 Benign neoplasm of transverse colon: Secondary | ICD-10-CM

## 2014-07-16 DIAGNOSIS — K635 Polyp of colon: Secondary | ICD-10-CM

## 2014-07-16 MED ORDER — SODIUM CHLORIDE 0.9 % IV SOLN: 500.0000 mL | INTRAVENOUS | Status: DC

## 2014-07-16 NOTE — Progress Notes (Signed)
Called to room to assist during endoscopic procedure.  Patient ID and intended procedure confirmed with present staff. Received instructions for my participation in the procedure from the performing physician.  

## 2014-07-16 NOTE — Op Note (Signed)
[facility]  [address] [place], [id number]   COLONOSCOPY PROCEDURE REPORT  PATIENT: Adam Estrada, Adam Estrada  MR#: [id number] BIRTHDATE: 08-17-1960 , 53  yrs. old GENDER: male ENDOSCOPIST: Milus Banister, MD REFERRED CW:[id number] Greene, M.D. PROCEDURE DATE:  07/16/2014 PROCEDURE:   Colonoscopy with snare polypectomy First Screening Colonoscopy - Avg.  risk and is 50 yrs.  old or older Yes.  Prior Negative Screening - Now for repeat screening. N/A  History of Adenoma - Now for follow-up colonoscopy & has been > or = to 3 yrs.  N/A  Polyps Removed Today? Yes. ASA CLASS:   Class II INDICATIONS:average risk patient for colon cancer. MEDICATIONS: Monitored anesthesia care and Propofol 250 mg IV  DESCRIPTION OF PROCEDURE:   After the risks benefits and alternatives of the procedure were thoroughly explained, informed consent was obtained.  The digital rectal exam revealed no abnormalities of the rectum.   The LB [id number]  endoscope was introduced through the anus and advanced to the cecum, which was identified by both the appendix and ileocecal valve. No adverse events experienced.   The quality of the prep was excellent.  The instrument was then slowly withdrawn as the colon was fully examined.  COLON FINDINGS: Two polyps were found, removed and sent to pathology.  These were both sessile, 3-21mm across, located in cecum and transverse, removed with cold snare.  The site in transverse colon slowly oozed more than is typical despite saline flush and 1 minute mechanical tamponade with scope tip and so I placed a single endoclips at the site with immediate hemostasis.  The examination was otherwise normal.  Retroflexed views revealed no abnormalities. The time to cecum=2 minutes 03 seconds.  Withdrawal time=13 minutes 30 seconds.  The scope was withdrawn and the procedure completed. COMPLICATIONS: There were no immediate complications.  ENDOSCOPIC IMPRESSION: Two  polyps were found, removed and sent to pathology.  These were both sessile, 3-31mm across, located in cecum and transverse, removed with cold snare.  The site in transverse colon slowly oozed more than is typical despite saline flush and 1 minute mechanical tamponade with scope tip and so I placed a single endoclips at the site with immediate hemostasis.  The examination was otherwise normal  RECOMMENDATIONS: If the polyp(s) removed today are proven to be adenomatous (pre-cancerous) polyps, you will need a repeat colonoscopy in 5 years.  Otherwise you should continue to follow colorectal cancer screening guidelines for "routine risk" patients with colonoscopy in 10 years.  You will receive a letter within 1-2 weeks with the results of your biopsy as well as final recommendations.  Please call my office if you have not received a letter after 3 weeks.  eSigned:  Milus Banister, MD 07/16/2014 3:58 PM

## 2014-07-16 NOTE — Patient Instructions (Signed)
Discharge instructions given. Handout on polyps. Resume previous medications. YOU HAD AN ENDOSCOPIC PROCEDURE TODAY AT THE [facility]: Refer to the procedure report that was given to you for any specific questions about what was found during the examination.  If the procedure report does not answer your questions, please call your gastroenterologist to clarify.  If you requested that your care partner not be given the details of your procedure findings, then the procedure report has been included in a sealed envelope for you to review at your convenience later.  YOU SHOULD EXPECT: Some feelings of bloating in the abdomen. Passage of more gas than usual.  Walking can help get rid of the air that was put into your GI tract during the procedure and reduce the bloating. If you had a lower endoscopy (such as a colonoscopy or flexible sigmoidoscopy) you may notice spotting of blood in your stool or on the toilet paper. If you underwent a bowel prep for your procedure, then you may not have a normal bowel movement for a few days.  DIET: Your first meal following the procedure should be a light meal and then it is ok to progress to your normal diet.  A half-sandwich or bowl of soup is an example of a good first meal.  Heavy or fried foods are harder to digest and may make you feel nauseous or bloated.  Likewise meals heavy in dairy and vegetables can cause extra gas to form and this can also increase the bloating.  Drink plenty of fluids but you should avoid alcoholic beverages for 24 hours.  ACTIVITY: Your care partner should take you home directly after the procedure.  You should plan to take it easy, moving slowly for the rest of the day.  You can resume normal activity the day after the procedure however you should NOT DRIVE or use heavy machinery for 24 hours (because of the sedation medicines used during the test).    SYMPTOMS TO REPORT IMMEDIATELY: A gastroenterologist can be reached at any  hour.  During normal business hours, 8:30 AM to 5:00 PM Monday through Friday, call [phone].  After hours and on weekends, please call the GI answering service at [phone] who will take a message and have the physician on call contact you.   Following lower endoscopy (colonoscopy or flexible sigmoidoscopy):  Excessive amounts of blood in the stool  Significant tenderness or worsening of abdominal pains  Swelling of the abdomen that is new, acute  Fever of 100F or higher  FOLLOW UP: If any biopsies were taken you will be contacted by phone or by letter within the next 1-3 weeks.  Call your gastroenterologist if you have not heard about the biopsies in 3 weeks.  Our staff will call the home number listed on your records the next business day following your procedure to check on you and address any questions or concerns that you may have at that time regarding the information given to you following your procedure. This is a courtesy call and so if there is no answer at the home number and we have not heard from you through the emergency physician on call, we will assume that you have returned to your regular daily activities without incident.  SIGNATURES/CONFIDENTIALITY: You and/or your care partner have signed paperwork which will be entered into your electronic medical record.  These signatures attest to the fact that that the information above on your After Visit Summary has been reviewed and is   understood.  Full responsibility of the confidentiality of this discharge information lies with you and/or your care-partner. 

## 2014-07-16 NOTE — Progress Notes (Signed)
Report to PACU, RN, vss, BBS= Clear.  

## 2014-07-19 ENCOUNTER — Telehealth: Payer: Self-pay | Admitting: *Deleted

## 2014-07-19 NOTE — Telephone Encounter (Signed)
  Follow up Call-  Call back number 07/16/2014  Post procedure Call Back phone  # [phone]  Permission to leave phone message Yes     Patient questions:  Do you have a fever, pain , or abdominal swelling? No. Pain Score  0 *  Have you tolerated food without any problems? Yes.    Have you been able to return to your normal activities? Yes.    Do you have any questions about your discharge instructions: Diet   No. Medications  No. Follow up visit  No.  Do you have questions or concerns about your Care? No.  Actions: * If pain score is 4 or above: No action needed, pain <4.

## 2014-07-22 ENCOUNTER — Encounter: Payer: Self-pay | Admitting: Gastroenterology

## 2014-08-09 ENCOUNTER — Ambulatory Visit (INDEPENDENT_AMBULATORY_CARE_PROVIDER_SITE_OTHER): Payer: 59

## 2014-08-09 ENCOUNTER — Ambulatory Visit (HOSPITAL_COMMUNITY)
Admission: RE | Admit: 2014-08-09 | Discharge: 2014-08-09 | Disposition: A | Discharge status: Home / Self Care | Payer: 59 | Source: Ambulatory Visit | Attending: Family Medicine | Admitting: Family Medicine

## 2014-08-09 ENCOUNTER — Ambulatory Visit (INDEPENDENT_AMBULATORY_CARE_PROVIDER_SITE_OTHER): Payer: 59 | Admitting: Family Medicine

## 2014-08-09 DIAGNOSIS — M542 Cervicalgia: Secondary | ICD-10-CM | POA: Diagnosis not present

## 2014-08-09 DIAGNOSIS — S161XXA Strain of muscle, fascia and tendon at neck level, initial encounter: Secondary | ICD-10-CM

## 2014-08-09 MED ORDER — CYCLOBENZAPRINE HCL 10 MG PO TABS: 10.0000 mg | ORAL_TABLET | Freq: Two times a day (BID) | ORAL | Status: DC | PRN
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
## 2014-08-11 ENCOUNTER — Telehealth: Payer: Self-pay

## 2014-08-11 NOTE — Telephone Encounter (Signed)
Pt states he was seen by Dr Lorelei Pont and would like a call back regarding his visit. Please call [phone]

## 2014-08-16 ENCOUNTER — Ambulatory Visit (INDEPENDENT_AMBULATORY_CARE_PROVIDER_SITE_OTHER): Payer: 59 | Admitting: Family Medicine

## 2014-08-16 ENCOUNTER — Encounter: Payer: Self-pay | Admitting: Family Medicine

## 2014-08-16 ENCOUNTER — Ambulatory Visit (INDEPENDENT_AMBULATORY_CARE_PROVIDER_SITE_OTHER): Payer: 59

## 2014-08-16 VITALS — BP 129/79 | HR 74 | Temp 98.1°F | Resp 16 | Ht 70.0 in | Wt 260.4 lb

## 2014-08-16 DIAGNOSIS — M545 Low back pain, unspecified: Secondary | ICD-10-CM

## 2014-08-16 DIAGNOSIS — M549 Dorsalgia, unspecified: Secondary | ICD-10-CM

## 2014-08-16 DIAGNOSIS — M546 Pain in thoracic spine: Secondary | ICD-10-CM

## 2014-08-16 DIAGNOSIS — Z0000 Encounter for general adult medical examination without abnormal findings: Secondary | ICD-10-CM

## 2014-08-16 DIAGNOSIS — N529 Male erectile dysfunction, unspecified: Secondary | ICD-10-CM

## 2014-08-16 NOTE — Progress Notes (Signed)
   Subjective:    Patient ID: Adam Estrada., male    DOB: 06/25/1960, 54 y.o.   MRN: [id number]  HPI    Review of Systems  Constitutional: Negative.   HENT: Negative.   Eyes: Positive for visual disturbance.  Respiratory: Negative.   Cardiovascular: Negative.   Gastrointestinal: Negative.   Endocrine: Negative.   Genitourinary: Negative.   Musculoskeletal: Positive for myalgias, back pain, arthralgias and neck pain.  Skin: Negative.   Allergic/Immunologic: Positive for environmental allergies.  Neurological: Negative.   Hematological: Negative.   Psychiatric/Behavioral: Negative.        Objective:   Physical Exam        Assessment & Plan:

## 2014-08-16 NOTE — Telephone Encounter (Signed)
Please advise 

## 2014-08-16 NOTE — Patient Instructions (Addendum)
Fasting labs in next week - You should receive a call or letter about your lab results within the next week to 10 days (once we receive blood). Lab orders already in system.  Start aspirin 81mg  each day.  You likely have a sprained ligament or strained muscle in the low back, which can lead to some muscle spasm as well. Try over the counter alleve or ibuprofen if needed, flexeril at night if needed.  Heat or ice to area as needed and the other treatments and exercises in the back care manual as tolerated. If not improving in next week -return for recheck.   Follow up in next 6 months, sooner if back not improving  Return to the clinic or go to the nearest emergency room if any of your symptoms worsen or new symptoms occur.  Keeping you healthy  Get these tests  Blood pressure- Have your blood pressure checked once a year by your healthcare provider.  Normal blood pressure is 120/80  Weight- Have your body mass index (BMI) calculated to screen for obesity.  BMI is a measure of body fat based on height and weight. You can also calculate your own BMI at [email or web address].  Cholesterol- Have your cholesterol checked every year.  Diabetes- Have your blood sugar checked regularly if you have high blood pressure, high cholesterol, have a family history of diabetes or if you are overweight.  Screening for Colon Cancer- Colonoscopy starting at age 58.  Screening may begin sooner depending on your family history and other health conditions. Follow up colonoscopy as directed by your Gastroenterologist.  Screening for Prostate Cancer- Both blood work (PSA) and a rectal exam help screen for Prostate Cancer.  Screening begins at age 43 with African-American men and at age 65 with Caucasian men.  Screening may begin sooner depending on your family history.  Take these medicines  Aspirin- One aspirin daily can help prevent Heart disease and Stroke.  Flu shot- Every fall.  Tetanus- Every 10  years.  Zostavax- Once after the age of 8 to prevent Shingles.  Pneumonia shot- Once after the age of 5; if you are younger than 24, ask your healthcare provider if you need a Pneumonia shot.  Take these steps  Don't smoke- If you do smoke, talk to your doctor about quitting.  For tips on how to quit, go to www.smokefree.gov or call 1-800-QUIT-NOW.  Be physically active- Exercise 5 days a week for at least 30 minutes.  If you are not already physically active start slow and gradually work up to 30 minutes of moderate physical activity.  Examples of moderate activity include walking briskly, mowing the yard, dancing, swimming, bicycling, etc.  Eat a healthy diet- Eat a variety of healthy food such as fruits, vegetables, low fat milk, low fat cheese, yogurt, lean meant, poultry, fish, beans, tofu, etc. For more information go to www.thenutritionsource.org  Drink alcohol in moderation- Limit alcohol intake to less than two drinks a day. Never drink and drive.  Dentist- Brush and floss twice daily; visit your dentist twice a year.  Depression- Your emotional health is as important as your physical health. If you're feeling down, or losing interest in things you would normally enjoy please talk to your healthcare provider.  Eye exam- Visit your eye doctor every year.  Safe sex- If you may be exposed to a sexually transmitted infection, use a condom.  Seat belts- Seat belts can save your life; always wear one.  Smoke/Carbon Building services engineer- Psychologist, sport and exercise  need to be installed on the appropriate level of your home.  Replace batteries at least once a year.  Skin cancer- When out in the sun, cover up and use sunscreen 15 SPF or higher.  Violence- If anyone is threatening you, please tell your healthcare provider.  Living Will/ Health care power of attorney- Speak with your healthcare provider and family.

## 2014-08-16 NOTE — Telephone Encounter (Signed)
Called him back-he has tried to apply for [facility] but is not sure if this is going to go through or not.  There is not any paperwork for me to have at this time.  He will keep me posted

## 2014-08-16 NOTE — Telephone Encounter (Signed)
Spoke to pt, he would like to know the status of his FMLA paperwork
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
## 2014-08-17 ENCOUNTER — Telehealth: Payer: Self-pay
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
## 2014-08-18 ENCOUNTER — Other Ambulatory Visit (INDEPENDENT_AMBULATORY_CARE_PROVIDER_SITE_OTHER): Payer: 59 | Admitting: Family Medicine

## 2014-08-18 DIAGNOSIS — Z13220 Encounter for screening for lipoid disorders: Secondary | ICD-10-CM | POA: Diagnosis not present

## 2014-08-18 DIAGNOSIS — Z130 Encounter for screening for diseases of the blood and blood-forming organs and certain disorders involving the immune mechanism: Secondary | ICD-10-CM

## 2014-08-18 DIAGNOSIS — Z125 Encounter for screening for malignant neoplasm of prostate: Secondary | ICD-10-CM

## 2014-08-18 DIAGNOSIS — N529 Male erectile dysfunction, unspecified: Secondary | ICD-10-CM

## 2014-08-18 DIAGNOSIS — Z131 Encounter for screening for diabetes mellitus: Secondary | ICD-10-CM

## 2014-08-18 LAB — COMPREHENSIVE METABOLIC PANEL
ALT: 31 U/L (ref 0–53)
AST: 21 U/L (ref 0–37)
Albumin: 4.1 g/dL (ref 3.5–5.2)
Alkaline Phosphatase: 97 U/L (ref 39–117)
BUN: 15 mg/dL (ref 6–23)
CO2: 23 mEq/L (ref 19–32)
Calcium: 9.4 mg/dL (ref 8.4–10.5)
Chloride: 106 mEq/L (ref 96–112)
Creat: 1.03 mg/dL (ref 0.50–1.35)
Glucose, Bld: 89 mg/dL (ref 70–99)
Potassium: 4.3 mEq/L (ref 3.5–5.3)
Sodium: 139 mEq/L (ref 135–145)
Total Bilirubin: 0.4 mg/dL (ref 0.2–1.2)
Total Protein: 6.9 g/dL (ref 6.0–8.3)

## 2014-08-18 LAB — CBC
HCT: 44.6 % (ref 39.0–52.0)
Hemoglobin: 15.3 g/dL (ref 13.0–17.0)
MCH: 28.8 pg (ref 26.0–34.0)
MCHC: 34.3 g/dL (ref 30.0–36.0)
MCV: 83.8 fL (ref 78.0–100.0)
MPV: 10.7 fL (ref 8.6–12.4)
Platelets: 241 10*3/uL (ref 150–400)
RBC: 5.32 MIL/uL (ref 4.22–5.81)
RDW: 14.7 % (ref 11.5–15.5)
WBC: 6.2 10*3/uL (ref 4.0–10.5)

## 2014-08-18 LAB — LIPID PANEL
Cholesterol: 154 mg/dL (ref 0–200)
HDL: 30 mg/dL — ABNORMAL LOW (ref >=40)
LDL Cholesterol: 100 mg/dL — ABNORMAL HIGH (ref 0–99)
Total CHOL/HDL Ratio: 5.1 Ratio
Triglycerides: 120 mg/dL (ref <150)
VLDL: 24 mg/dL (ref 0–40)

## 2014-08-18 NOTE — Telephone Encounter (Signed)
Received PPW today, completed and placed in FMLA box

## 2014-08-19 ENCOUNTER — Telehealth: Payer: Self-pay | Admitting: Physician Assistant

## 2014-08-19 DIAGNOSIS — R7989 Other specified abnormal findings of blood chemistry: Secondary | ICD-10-CM

## 2014-08-19 LAB — PSA: PSA: 2.69 ng/mL (ref <=4.00)

## 2014-08-19 LAB — TESTOSTERONE: TESTOSTERONE: 207 ng/dL — AB (ref 300–890)

## 2014-08-19 NOTE — Telephone Encounter (Signed)
Spoke with pt regarding lab values. Discussed mildly elevated LDL and importance of limiting saturated fats. Discussed low testosterone reading. We will refer him to urology for further workup, he expresses understanding.

## 2014-09-03 DIAGNOSIS — Z0271 Encounter for disability determination: Secondary | ICD-10-CM

## 2014-10-07 ENCOUNTER — Encounter: Payer: Self-pay | Admitting: Family Medicine

## 2014-10-08 NOTE — Telephone Encounter (Signed)
He was seen on a Monday.  He would like his work note to be for the week, Monday through Friday. I had written his original note from Monday through Thursday.

## 2015-06-22 DIAGNOSIS — E291 Testicular hypofunction: Secondary | ICD-10-CM | POA: Diagnosis not present

## 2015-06-22 MED FILL — TESTOSTERON CYP 1,000 MG/10: 100 | 70 days supply | Qty: 10 | Fill #0

## 2015-06-22 MED FILL — BD 3 ML SYRINGE 18GX1-1/2: 18G X 1-1/2 | 90 days supply | Qty: 20 | Fill #0

## 2015-06-22 MED FILL — BD NEEDLES 22GX1.5: 22G X 1-1/2 | 90 days supply | Qty: 20 | Fill #0

## 2015-06-23 ENCOUNTER — Encounter: Payer: Self-pay | Admitting: Family Medicine

## 2015-06-23 ENCOUNTER — Telehealth: Payer: Self-pay

## 2015-06-23 DIAGNOSIS — N529 Male erectile dysfunction, unspecified: Secondary | ICD-10-CM

## 2015-06-23 DIAGNOSIS — M542 Cervicalgia: Secondary | ICD-10-CM

## 2015-06-23 NOTE — Telephone Encounter (Signed)
Pt is needing to talk with dr Carlota Raspberry about taking viagra  Best number [phone]

## 2015-06-24 NOTE — Telephone Encounter (Signed)
Dr. Carlota Raspberry do you want me to tell pt to come in?

## 2015-06-26 MED ORDER — SILDENAFIL CITRATE 100 MG PO TABS: 50.0000 mg | ORAL_TABLET | Freq: Every day | ORAL | Status: DC | PRN

## 2015-06-26 NOTE — Telephone Encounter (Signed)
I sent in 5 with a refill, but OV needed for further refills. If there are other questions about the med - let me know to see if I can handle it by message, but may need to discuss in office.

## 2015-06-27 NOTE — Telephone Encounter (Signed)
Called pt and advised message from provider on their voicemail.  

## 2015-08-08 DIAGNOSIS — E291 Testicular hypofunction: Secondary | ICD-10-CM | POA: Diagnosis not present

## 2015-08-30 DIAGNOSIS — Z0000 Encounter for general adult medical examination without abnormal findings: Secondary | ICD-10-CM | POA: Diagnosis not present

## 2015-08-30 DIAGNOSIS — E291 Testicular hypofunction: Secondary | ICD-10-CM | POA: Diagnosis not present

## 2015-08-30 DIAGNOSIS — R972 Elevated prostate specific antigen [PSA]: Secondary | ICD-10-CM | POA: Diagnosis not present

## 2015-08-30 MED FILL — TESTOSTERON CYP 1,000 MG/10: 100 | 70 days supply | Qty: 10 | Fill #1

## 2015-08-30 MED FILL — SILDENAFIL 20 MG TABLET: 20 | 6 days supply | Qty: 30 | Fill #0

## 2017-01-21 ENCOUNTER — Encounter: Payer: Self-pay | Admitting: Family Medicine

## 2017-01-21 ENCOUNTER — Ambulatory Visit (INDEPENDENT_AMBULATORY_CARE_PROVIDER_SITE_OTHER): Payer: 59

## 2017-01-21 ENCOUNTER — Ambulatory Visit (INDEPENDENT_AMBULATORY_CARE_PROVIDER_SITE_OTHER): Payer: 59 | Admitting: Family Medicine

## 2017-01-21 VITALS — BP 126/81 | HR 70 | Temp 98.2°F | Resp 16 | Ht 70.0 in | Wt 273.0 lb

## 2017-01-21 DIAGNOSIS — Z789 Other specified health status: Secondary | ICD-10-CM

## 2017-01-21 DIAGNOSIS — R0789 Other chest pain: Secondary | ICD-10-CM

## 2017-01-21 DIAGNOSIS — Z86718 Personal history of other venous thrombosis and embolism: Secondary | ICD-10-CM | POA: Diagnosis not present

## 2017-01-21 DIAGNOSIS — N6459 Other signs and symptoms in breast: Secondary | ICD-10-CM | POA: Diagnosis not present

## 2017-01-21 NOTE — Patient Instructions (Addendum)
I will refer you for an ultrasound as well as mammogram to evaluate the sore/abnormal area on the left breast. If those are both normal, return to discuss other treatment or causes.  Chest pain is atypical at present. Previous cardiology workup was reassuring. I will check blood clot tests given your recent travel, but less likely cause. I will also refer you to cardiology to discuss pain further.   Return to the clinic or go to the nearest emergency room if any of your symptoms worsen or new symptoms occur.   Nonspecific Chest Pain Chest pain can be caused by many different conditions. There is always a chance that your pain could be related to something serious, such as a heart attack or a blood clot in your lungs. Chest pain can also be caused by conditions that are not life-threatening. If you have chest pain, it is very important to follow up with your health care provider. What are the causes? Causes of this condition include:  Heartburn.  Pneumonia or bronchitis.  Anxiety or stress.  Inflammation around your heart (pericarditis) or lung (pleuritis or pleurisy).  A blood clot in your lung.  A collapsed lung (pneumothorax). This can develop suddenly on its own (spontaneous pneumothorax) or from trauma to the chest.  Shingles infection (varicella-zoster virus).  Heart attack.  Damage to the bones, muscles, and cartilage that make up your chest wall. This can include: ? Bruised bones due to injury. ? Strained muscles or cartilage due to frequent or repeated coughing or overwork. ? Fracture to one or more ribs. ? Sore cartilage due to inflammation (costochondritis).  What increases the risk? Risk factors for this condition may include:  Activities that increase your risk for trauma or injury to your chest.  Respiratory infections or conditions that cause frequent coughing.  Medical conditions or overeating that can cause heartburn.  Heart disease or family history of  heart disease.  Conditions or health behaviors that increase your risk of developing a blood clot.  Having had chicken pox (varicella zoster).  What are the signs or symptoms? Chest pain can feel like:  Burning or tingling on the surface of your chest or deep in your chest.  Crushing, pressure, aching, or squeezing pain.  Dull or sharp pain that is worse when you move, cough, or take a deep breath.  Pain that is also felt in your back, neck, shoulder, or arm, or pain that spreads to any of these areas.  Your chest pain may come and go, or it may stay constant. How is this diagnosed? Lab tests or other studies may be needed to find the cause of your pain. Your health care provider may have you take a test called an ECG (electrocardiogram). An ECG records your heartbeat patterns at the time the test is performed. You may also have other tests, such as:  Transthoracic echocardiogram (TTE). In this test, sound waves are used to create a picture of the heart structures and to look at how blood flows through your heart.  Transesophageal echocardiogram (TEE).This is a more advanced imaging test that takes images from inside your body. It allows your health care provider to see your heart in finer detail.  Cardiac monitoring. This allows your health care provider to monitor your heart rate and rhythm in real time.  Holter monitor. This is a portable device that records your heartbeat and can help to diagnose abnormal heartbeats. It allows your health care provider to track your heart activity for several  days, if needed.  Stress tests. These can be done through exercise or by taking medicine that makes your heart beat more quickly.  Blood tests.  Other imaging tests.  How is this treated? Treatment depends on what is causing your chest pain. Treatment may include:  Medicines. These may include: ? Acid blockers for heartburn. ? Anti-inflammatory medicine. ? Pain medicine for  inflammatory conditions. ? Antibiotic medicine, if an infection is present. ? Medicines to dissolve blood clots. ? Medicines to treat coronary artery disease (CAD).  Supportive care for conditions that do not require medicines. This may include: ? Resting. ? Applying heat or cold packs to injured areas. ? Limiting activities until pain decreases.  Follow these instructions at home: Medicines  If you were prescribed an antibiotic, take it as told by your health care provider. Do not stop taking the antibiotic even if you start to feel better.  Take over-the-counter and prescription medicines only as told by your health care provider. Lifestyle  Do not use any products that contain nicotine or tobacco, such as cigarettes and e-cigarettes. If you need help quitting, ask your health care provider.  Do not drink alcohol.  Make lifestyle changes as directed by your health care provider. These may include: ? Getting regular exercise. Ask your health care provider to suggest some activities that are safe for you. ? Eating a heart-healthy diet. A registered dietitian can help you to learn healthy eating options. ? Maintaining a healthy weight. ? Managing diabetes, if necessary. ? Reducing stress, such as with yoga or relaxation techniques. General instructions  Avoid any activities that bring on chest pain.  If heartburn is the cause for your chest pain, raise (elevate) the head of your bed about 6 inches (15 cm) by putting blocks under the legs. Sleeping with more pillows does not effectively relieve heartburn because it only changes the position of your head.  Keep all follow-up visits as told by your health care provider. This is important. This includes any further testing if your chest pain does not go away. Contact a health care provider if:  Your chest pain does not go away.  You have a rash with blisters on your chest.  You have a fever.  You have chills. Get help right  away if:  Your chest pain is worse.  You have a cough that gets worse, or you cough up blood.  You have severe pain in your abdomen.  You have severe weakness.  You faint.  You have sudden, unexplained chest discomfort.  You have sudden, unexplained discomfort in your arms, back, neck, or jaw.  You have shortness of breath at any time.  You suddenly start to sweat, or your skin gets clammy.  You feel nauseous or you vomit.  You suddenly feel light-headed or dizzy.  Your heart begins to beat quickly, or it feels like it is skipping beats. These symptoms may represent a serious problem that is an emergency. Do not wait to see if the symptoms will go away. Get medical help right away. Call your local emergency services (911 in the [place] Do not drive yourself to the hospital. This information is not intended to replace advice given to you by your health care provider. Make sure you discuss any questions you have with your health care provider. Document Released: 02/21/2005 Document Revised: 02/06/2016 Document Reviewed: 02/06/2016 Elsevier Interactive Patient Education  2017 [employer].   IF you received an x-ray today, you will receive an invoice from  [facility]. Please contact [facility] at [phone] with questions or concerns regarding your invoice.   IF you received labwork today, you will receive an invoice from [place]. Please contact [facility] at [phone] with questions or concerns regarding your invoice.   Our billing staff will not be able to assist you with questions regarding bills from these companies.  You will be contacted with the lab results as soon as they are available. The fastest way to get your results is to activate your My Chart account. Instructions are located on the last page of this paperwork. If you have not heard from [place] regarding the results in 2 weeks, please contact this office.

## 2017-01-21 NOTE — Progress Notes (Addendum)
Subjective:  By signing my name below, I, Moises Blood, attest that this documentation has been prepared under the direction and in the presence of Merri Ray, MD. Electronically Signed: Moises Blood, [place]. 01/21/2017 , 2:46 PM .  Patient was seen in Room 2 .   Patient ID: Adam Estrada., male    DOB: March 06, 1961, 56 y.o.   MRN: [id number] Chief Complaint  Patient presents with  . Chest Pain    across full chest no radiation, sob, dizz. Some nausea ongoing issue  . Edema    right breast    HPI Adam Estrada. is a 56 y.o. male  Patient has history of chest pain, evaluated in June 2014, ruled out for MI. He completed stage III of Bruce protocol. He had normal Myoview. He was seen in Aug 2014 by cardiologist for follow up without any needed further follow up.   Chest pressure He mentions chest pain/pressure started below his right breast about 2 weeks ago, then radiating up to his right shoulder area. He's been experiencing intermittent chest pressure, similar to pneumonia (been a while). When he had the episode today, he states it occurred out of the blue. He denies any specific movement or exertion causing the pressure to worsen or exacerbate. He describes episodes lasting throughout the whole day; rates discomfort about a 2/10. He has some nausea with the pressure. He denies shortness of breath, nausea, vomiting, cough or fever. He denies any heartburn than usual, burping, or belching. He has not taking medication for this issue.   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 He has history of blood clot in calf when he had achilles surgery about 6 years ago. He was on blood thinners at that time for 3-4 months. He denies any new swelling or pain in his calves; although, he's been having pain his achilles up past 3 years. He has history of ruptured achilles on his left.   Swelling in  left breast He mentions having some swelling in his left breast with intermittent pain that was noticed about 6 months ago. He denies any changes in size in the area. He denies discharge or bleeding from nipple.   Family history He recently found out more family history of heart issues in both sides of his extended family.  Maternal uncle: cardiac episode recently, requiring pacemaker and defibrillator  Paternal grandmother: heart problems Mother and father: no known heart issues  Back pain He also mentions having occasional back pain. He believes this is due to his physical work.   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   Patient Active Problem List   Diagnosis Date Noted  . Chest pain 11/13/2012  . Bradycardia 11/13/2012   Past Medical History:  Diagnosis Date  . Allergy   . Bradycardia   . Chest pain   . DVT (deep venous thrombosis) ([place])   . HTN (hypertension)   . [place] fever    Past Surgical History:  Procedure Laterality Date  . ACHILLES TENDON REPAIR    . VASECTOMY     No Known Allergies Prior to Admission medications   Medication Sig Start Date End Date Taking? Authorizing Provider  sildenafil (VIAGRA) 100 MG tablet Take 0.5-1 tablets (50-100 mg total) by mouth daily as needed for erectile dysfunction. 06/26/15  Yes Wendie Agreste, MD  cyclobenzaprine (FLEXERIL) 10 MG tablet Take 1 tablet (  10 mg total) by mouth 2 (two) times daily as needed for muscle spasms. Patient not taking: Reported on 08/16/2014 08/09/14   Copland, Gay Filler, MD   Social History   Social History  . Marital status: Married    Spouse name: N/A  . Number of children: N/A  . Years of education: N/A   Occupational History  . Customer Serv Coordinator    Social History Main Topics  . Smoking status: Never Smoker  . Smokeless tobacco: Never Used  . Alcohol use No  . Drug use: No  . Sexual activity: Yes   Other Topics Concern  . Not on file   Social History Narrative    Married. Education: [employer].    Review of Systems  Constitutional: Negative for fatigue, fever and unexpected weight change.  Eyes: Negative for visual disturbance.  Respiratory: Negative for cough, chest tightness and shortness of breath.   Cardiovascular: Negative for chest pain, palpitations and leg swelling.       Chest pressure  Gastrointestinal: Positive for nausea. Negative for abdominal pain and blood in stool.  Musculoskeletal: Positive for back pain.  Neurological: Positive for dizziness. Negative for light-headedness and headaches.       Objective:   Physical Exam  Constitutional: He is oriented to person, place, and time. He appears well-developed and well-nourished.  HENT:  Head: Normocephalic and atraumatic.  Eyes: Pupils are equal, round, and reactive to light. EOM are normal.  Neck: No JVD present. Carotid bruit is not present.  Cardiovascular: Normal rate, regular rhythm and normal heart sounds.   No murmur heard. Pulmonary/Chest: Effort normal and breath sounds normal. He has no rales. He exhibits no tenderness. Left breast exhibits tenderness. Left breast exhibits no nipple discharge and no skin change. Breasts are asymmetrical.  Left breast slightly asymmetric from the right with tenderness below the areola; no discharge or bleeding from left nipple, no skin changes, no retraction; some prominence in left than the right; chest wall non tender  Musculoskeletal: He exhibits no edema.  Negative homans, calves non tender, no calf swelling  Lymphadenopathy:    He has no axillary adenopathy.       Right: No supraclavicular adenopathy present.       Left: No supraclavicular adenopathy present.  Neurological: He is alert and oriented to person, place, and time.  Skin: Skin is warm and dry.  Psychiatric: He has a normal mood and affect.  Vitals reviewed.   Vitals:   01/21/17 1401  BP: 126/81  Pulse: 70  Resp: 16  Temp: 98.2 F (36.8 C)  TempSrc: Oral  SpO2:  99%  Weight: 273 lb (123.8 kg)  Height: 5\' 10"  (1.778 m)   EKG: sinus rhythm, flattening of T-wave in v6, also seen in previous EKG; no acute findings.   Dg Chest 2 View  Result Date: 01/21/2017 CLINICAL DATA:  Chest pressure. EXAM: CHEST  2 VIEW COMPARISON:  11/12/2012 FINDINGS: The heart size and mediastinal contours are within normal limits. Both lungs are clear. The visualized skeletal structures are unremarkable. IMPRESSION: Normal exam. Electronically Signed   By: Lorriane Shire M.D.   On: 01/21/2017 14:53   Results for orders placed or performed in visit on 01/21/17  D-dimer, quantitative (not at [facility])  Result Value Ref Range   D-DIMER 0.39 0.00 - 0.49 mg/L FEU        Assessment & Plan:    Adam Estrada. is a 56 y.o. male Chest pressure - Plan: EKG 12-Lead,  DG Chest 2 View, Ambulatory referral to Cardiology History of recent travel - Plan: D-dimer, quantitative (not at [facility]) History of DVT (deep vein thrombosis) - Plan: D-dimer, quantitative (not at [facility])  - Nonexertional, atypical chest pain. Previous cardiac workup reassuring, but that was 4 years ago. Reassuring chest x-ray, EKG.  - Referred back to cardiology for further evaluation given persistent symptoms and reported family history of cardiac disease. No other known cardiac risk factors besides age at this time  - Check d-dimer with recent travel and reported history of DVT postoperatively from Achilles repair - DDimer normal, unlikely DVT.   - ER/RTC precautions discussed if worsening symptoms.  Abnormal breast finding - Plan: MM Digital Diagnostic Unilat L, US BREAST COMPLETE UNI LEFT INC AXILLA  - Fullness/asymmetry of left versus right breast. Pain below areola without palpable mass or nodule. No skin changes, no nipple discharge.  - Check mammogram, ultrasound, then follow-up to discuss other causes or workup if needed. Does report some weight gain, may be asymmetric adipose deposition.  No orders of the  defined types were placed in this encounter.  Patient Instructions   I will refer you for an ultrasound as well as mammogram to evaluate the sore/abnormal area on the left breast. If those are both normal, return to discuss other treatment or causes.  Chest pain is atypical at present. Previous cardiology workup was reassuring. I will check blood clot tests given your recent travel, but less likely cause. I will also refer you to cardiology to discuss pain further.   Return to the clinic or go to the nearest emergency room if any of your symptoms worsen or new symptoms occur.   Nonspecific Chest Pain Chest pain can be caused by many different conditions. There is always a chance that your pain could be related to something serious, such as a heart attack or a blood clot in your lungs. Chest pain can also be caused by conditions that are not life-threatening. If you have chest pain, it is very important to follow up with your health care provider. What are the causes? Causes of this condition include:  Heartburn.  Pneumonia or bronchitis.  Anxiety or stress.  Inflammation around your heart (pericarditis) or lung (pleuritis or pleurisy).  A blood clot in your lung.  A collapsed lung (pneumothorax). This can develop suddenly on its own (spontaneous pneumothorax) or from trauma to the chest.  Shingles infection (varicella-zoster virus).  Heart attack.  Damage to the bones, muscles, and cartilage that make up your chest wall. This can include: ? Bruised bones due to injury. ? Strained muscles or cartilage due to frequent or repeated coughing or overwork. ? Fracture to one or more ribs. ? Sore cartilage due to inflammation (costochondritis).  What increases the risk? Risk factors for this condition may include:  Activities that increase your risk for trauma or injury to your chest.  Respiratory infections or conditions that cause frequent coughing.  Medical conditions or  overeating that can cause heartburn.  Heart disease or family history of heart disease.  Conditions or health behaviors that increase your risk of developing a blood clot.  Having had chicken pox (varicella zoster).  What are the signs or symptoms? Chest pain can feel like:  Burning or tingling on the surface of your chest or deep in your chest.  Crushing, pressure, aching, or squeezing pain.  Dull or sharp pain that is worse when you move, cough, or take a deep breath.  Pain that is  also felt in your back, neck, shoulder, or arm, or pain that spreads to any of these areas.  Your chest pain may come and go, or it may stay constant. How is this diagnosed? Lab tests or other studies may be needed to find the cause of your pain. Your health care provider may have you take a test called an ECG (electrocardiogram). An ECG records your heartbeat patterns at the time the test is performed. You may also have other tests, such as:  Transthoracic echocardiogram (TTE). In this test, sound waves are used to create a picture of the heart structures and to look at how blood flows through your heart.  Transesophageal echocardiogram (TEE).This is a more advanced imaging test that takes images from inside your body. It allows your health care provider to see your heart in finer detail.  Cardiac monitoring. This allows your health care provider to monitor your heart rate and rhythm in real time.  Holter monitor. This is a portable device that records your heartbeat and can help to diagnose abnormal heartbeats. It allows your health care provider to track your heart activity for several days, if needed.  Stress tests. These can be done through exercise or by taking medicine that makes your heart beat more quickly.  Blood tests.  Other imaging tests.  How is this treated? Treatment depends on what is causing your chest pain. Treatment may include:  Medicines. These may include: ? Acid blockers  for heartburn. ? Anti-inflammatory medicine. ? Pain medicine for inflammatory conditions. ? Antibiotic medicine, if an infection is present. ? Medicines to dissolve blood clots. ? Medicines to treat coronary artery disease (CAD).  Supportive care for conditions that do not require medicines. This may include: ? Resting. ? Applying heat or cold packs to injured areas. ? Limiting activities until pain decreases.  Follow these instructions at home: Medicines  If you were prescribed an antibiotic, take it as told by your health care provider. Do not stop taking the antibiotic even if you start to feel better.  Take over-the-counter and prescription medicines only as told by your health care provider. Lifestyle  Do not use any products that contain nicotine or tobacco, such as cigarettes and e-cigarettes. If you need help quitting, ask your health care provider.  Do not drink alcohol.  Make lifestyle changes as directed by your health care provider. These may include: ? Getting regular exercise. Ask your health care provider to suggest some activities that are safe for you. ? Eating a heart-healthy diet. A registered dietitian can help you to learn healthy eating options. ? Maintaining a healthy weight. ? Managing diabetes, if necessary. ? Reducing stress, such as with yoga or relaxation techniques. General instructions  Avoid any activities that bring on chest pain.  If heartburn is the cause for your chest pain, raise (elevate) the head of your bed about 6 inches (15 cm) by putting blocks under the legs. Sleeping with more pillows does not effectively relieve heartburn because it only changes the position of your head.  Keep all follow-up visits as told by your health care provider. This is important. This includes any further testing if your chest pain does not go away. Contact a health care provider if:  Your chest pain does not go away.  You have a rash with blisters on your  chest.  You have a fever.  You have chills. Get help right away if:  Your chest pain is worse.  You have a cough that  gets worse, or you cough up blood.  You have severe pain in your abdomen.  You have severe weakness.  You faint.  You have sudden, unexplained chest discomfort.  You have sudden, unexplained discomfort in your arms, back, neck, or jaw.  You have shortness of breath at any time.  You suddenly start to sweat, or your skin gets clammy.  You feel nauseous or you vomit.  You suddenly feel light-headed or dizzy.  Your heart begins to beat quickly, or it feels like it is skipping beats. These symptoms may represent a serious problem that is an emergency. Do not wait to see if the symptoms will go away. Get medical help right away. Call your local emergency services (911 in the [place] Do not drive yourself to the hospital. This information is not intended to replace advice given to you by your health care provider. Make sure you discuss any questions you have with your health care provider. Document Released: 02/21/2005 Document Revised: 02/06/2016 Document Reviewed: 02/06/2016 Elsevier Interactive Patient Education  2017 [employer].   IF you received an x-ray today, you will receive an invoice from [facility]. Please contact [facility] at [phone] with questions or concerns regarding your invoice.   IF you received labwork today, you will receive an invoice from [place]. Please contact [facility] at [phone] with questions or concerns regarding your invoice.   Our billing staff will not be able to assist you with questions regarding bills from these companies.  You will be contacted with the lab results as soon as they are available. The fastest way to get your results is to activate your My Chart account. Instructions are located on the last page of this paperwork. If you have not heard from [place] regarding the results in 2 weeks, please  contact this office.       I personally performed the services described in this documentation, which was scribed in my presence. The recorded information has been reviewed and considered for accuracy and completeness, addended by me as needed, and agree with information above.  Signed,   Merri Ray, MD [facility].  01/21/17 4:35 PM

## 2017-01-22 DIAGNOSIS — H5213 Myopia, bilateral: Secondary | ICD-10-CM | POA: Diagnosis not present

## 2017-01-22 LAB — D-DIMER, QUANTITATIVE (NOT AT ARMC): D-DIMER: 0.39 mg{FEU}/L (ref 0.00–0.49)

## 2017-03-11 NOTE — Progress Notes (Signed)
Cardiology Office Note   Date:  03/13/2017   ID:  [name]., DOB 25-Feb-1961, MRN [id number]  PCP:  Wendie Agreste, MD  Cardiologist:   Peter Martinique, MD   Chief Complaint  Patient presents with  . Follow-up    NP.   Marland Kitchen Chest Pain    Pressure.  . Shortness of Breath  . Edema      History of Present Illness: Adam Estrada. is a 56 y.o. male who is seen at the request of Dr. Carlota Raspberry for evaluation of chest pain. He was hospitalized in June 2014 for chest pain. He ruled out for myocardial infarction. A stress Myoview was performed. He was able to complete stage III of the Bruce protocol. He had no significant ST changes and his Myoview study was normal. He did have a hypertensive blood pressure response to exercise. Echocardiogram was normal.   Over the past month he has noticed some symptoms of chest pain and tightness. Sometimes this is in the left upper chest. Sometimes it begins under his right ribs and radiates to his right shoulder. It is not consistent and doesn't relate to activity. Sometimes relieved with pressure on the chest wall. D dimer was normal. He admits he is sedentary and has gained a lot of weight.     Past Medical History:  Diagnosis Date  . Allergy   . Bradycardia   . Chest pain   . DVT (deep venous thrombosis) ([place])   . HTN (hypertension)   . [place] fever     Past Surgical History:  Procedure Laterality Date  . ACHILLES TENDON REPAIR    . VASECTOMY       Current Outpatient Prescriptions  Medication Sig Dispense Refill  . sildenafil (VIAGRA) 100 MG tablet Take 0.5-1 tablets (50-100 mg total) by mouth daily as needed for erectile dysfunction. 5 tablet 1   No current facility-administered medications for this visit.     Allergies:   Patient has no known allergies.    Social History:  The patient  reports that he has never smoked. He has never used smokeless tobacco. He reports that he does not drink alcohol or use drugs.     Family History:  The patient's family history includes Breast cancer in his maternal aunt; Heart disease in his maternal aunt, maternal grandmother, and other; Pneumonia in his father.    ROS:  Please see the history of present illness.   Otherwise, review of systems are positive for none.   All other systems are reviewed and negative.    PHYSICAL EXAM: VS:  BP 122/88   Pulse 76   Ht 5\' 10"  (1.778 m)   Wt 273 lb (123.8 kg)   BMI 39.17 kg/m  , BMI Body mass index is 39.17 kg/m. GEN: Well nourished, well developed, in no acute distress  HEENT: normal  Neck: no JVD, carotid bruits, or masses Cardiac: RRR; no murmurs, rubs, or gallops,no edema  Respiratory:  clear to auscultation bilaterally, normal work of breathing GI: soft, nontender, nondistended, + BS MS: no deformity or atrophy  Skin: warm and dry, no rash Neuro:  Strength and sensation are intact Psych: euthymic mood, full affect   EKG:  EKG is not ordered today. The ekg ordered 01/21/17  demonstrates NSR with mild nonspecific T wave abnormality. I have personally reviewed and interpreted this study.    Recent Labs: No results found for requested labs within last 8760 hours.    Lipid Panel  Component Value Date/Time   CHOL 154 08/18/2014 1218   TRIG 120 08/18/2014 1218   HDL 30 (L) 08/18/2014 1218   CHOLHDL 5.1 08/18/2014 1218   VLDL 24 08/18/2014 1218   LDLCALC 100 (H) 08/18/2014 1218      Wt Readings from Last 3 Encounters:  03/13/17 273 lb (123.8 kg)  01/21/17 273 lb (123.8 kg)  08/16/14 260 lb 6.4 oz (118.1 kg)      ASSESSMENT AND PLAN:  1.  Atypical chest pain. Patient has a history of borderline HTN, low HDL. Prior evaluation in 2014 was negative. Recommend routine ETT. If unchanged from 2014 I would recommend lifestyle modification with heart healthy diet, weight loss, and regular aerobic activity.   Labs/ tests ordered today include:   Orders Placed This Encounter  Procedures  . Exercise  Tolerance Test     Disposition:   FU TBD based on stress test.  Signed, Peter Martinique, MD  03/13/2017 9:18 AM    [facility] [address] [place], [place], [address] Phone [phone], Fax [phone]

## 2017-03-13 ENCOUNTER — Ambulatory Visit (INDEPENDENT_AMBULATORY_CARE_PROVIDER_SITE_OTHER): Payer: 59 | Admitting: Cardiology

## 2017-03-13 ENCOUNTER — Encounter: Payer: Self-pay | Admitting: Cardiology

## 2017-03-13 VITALS — BP 122/88 | HR 76 | Ht 70.0 in | Wt 273.0 lb

## 2017-03-13 DIAGNOSIS — E785 Hyperlipidemia, unspecified: Secondary | ICD-10-CM | POA: Diagnosis not present

## 2017-03-13 DIAGNOSIS — R079 Chest pain, unspecified: Secondary | ICD-10-CM

## 2017-03-13 NOTE — Patient Instructions (Signed)
We will schedule you for a stress test  If OK you need to focus on regular aerobic exercise, weight loss, and healthy diet.

## 2017-03-19 ENCOUNTER — Encounter: Payer: 59 | Admitting: Family Medicine

## 2017-03-22 ENCOUNTER — Telehealth (HOSPITAL_COMMUNITY): Payer: Self-pay | Admitting: *Deleted

## 2017-03-22 NOTE — Telephone Encounter (Signed)
Close encounter 

## 2017-03-27 ENCOUNTER — Ambulatory Visit (HOSPITAL_COMMUNITY): Admission: RE | Admit: 2017-03-27 | Payer: 59 | Source: Ambulatory Visit | Attending: Cardiology | Admitting: Cardiology

## 2017-05-14 ENCOUNTER — Telehealth (HOSPITAL_COMMUNITY): Payer: Self-pay | Admitting: *Deleted

## 2017-05-14 NOTE — Telephone Encounter (Signed)
Close encounter 

## 2017-05-16 ENCOUNTER — Ambulatory Visit (HOSPITAL_COMMUNITY)
Admission: RE | Admit: 2017-05-16 | Discharge: 2017-05-16 | Disposition: A | Discharge status: Home / Self Care | Payer: 59 | Source: Ambulatory Visit | Attending: Cardiology | Admitting: Cardiology

## 2017-05-16 DIAGNOSIS — R079 Chest pain, unspecified: Secondary | ICD-10-CM | POA: Diagnosis not present

## 2017-05-16 DIAGNOSIS — E785 Hyperlipidemia, unspecified: Secondary | ICD-10-CM | POA: Diagnosis not present

## 2017-05-16 LAB — EXERCISE TOLERANCE TEST
CHL RATE OF PERCEIVED EXERTION: 17
CSEPED: 9 min
CSEPEW: 11.5 METS
CSEPPHR: 157 {beats}/min
Exercise duration (sec): 53 s
MPHR: 167 {beats}/min
Percent HR: 94 %
Rest HR: 61 {beats}/min

## 2017-06-19 ENCOUNTER — Ambulatory Visit (INDEPENDENT_AMBULATORY_CARE_PROVIDER_SITE_OTHER): Payer: Self-pay | Admitting: Emergency Medicine

## 2017-06-19 VITALS — BP 124/86 | HR 94 | Temp 98.8°F | Resp 17 | Wt 275.4 lb

## 2017-06-19 DIAGNOSIS — J40 Bronchitis, not specified as acute or chronic: Secondary | ICD-10-CM

## 2017-06-19 MED ORDER — BENZONATATE 200 MG PO CAPS: 200.0000 mg | ORAL_CAPSULE | Freq: Two times a day (BID) | ORAL | 0 refills | Status: DC | PRN

## 2017-06-19 MED ORDER — AZITHROMYCIN 250 MG PO TABS: ORAL_TABLET | ORAL | 0 refills | Status: DC

## 2017-06-19 MED FILL — AZITHROMYCIN 250 MG TAB: 250 | 1 days supply | Qty: 6 | Fill #0

## 2017-06-19 MED FILL — BENZONATATE 200 MG CAP: 200 | 10 days supply | Qty: 20 | Fill #0

## 2017-06-19 NOTE — Progress Notes (Signed)
Subjective:     Adam Estrada. is a 57 y.o. male who presents for evaluation of symptoms of a dry, harsh cough. Symptoms include non-productive, dry hacking cough, no fever, or chills, does complain of weakness. Onset of symptoms was 4 weeks ago, and has been gradually worsening since that time. Treatment to date: cough suppressants. Pt does not smoke, no history of asthma  The following portions of the patient's history were reviewed and updated as appropriate: allergies and current medications.  Review of Systems Pertinent items are noted in HPI.   Objective:    BP 124/86 (BP Location: Right Arm, Patient Position: Sitting, Cuff Size: Large)   Pulse 94   Temp 98.8 F (37.1 C) (Oral)   Resp 17   Wt 275 lb 6.4 oz (124.9 kg)   SpO2 98%   BMI 39.52 kg/m  General appearance: alert, cooperative and appears stated age Head: Normocephalic, without obvious abnormality, atraumatic Eyes: negative Ears: normal TM's and external ear canals both ears Nose: Nares normal. Septum midline. Mucosa normal. No drainage or sinus tenderness. Throat: lips, mucosa, and tongue normal; teeth and gums normal Lungs: clear to auscultation bilaterally Heart: regular rate and rhythm Extremities: extremities normal, atraumatic, no cyanosis or edema Pulses: 2+ and symmetric   Assessment:    bronchitis   Plan:    Suggested symptomatic OTC remedies. Nasal saline spray for congestion. Zithromax per orders. Follow up as needed.

## 2017-06-19 NOTE — Patient Instructions (Signed)

## 2017-07-10 ENCOUNTER — Ambulatory Visit (INDEPENDENT_AMBULATORY_CARE_PROVIDER_SITE_OTHER): Payer: Self-pay | Admitting: Nurse Practitioner

## 2017-07-10 ENCOUNTER — Encounter: Payer: Self-pay | Admitting: Nurse Practitioner

## 2017-07-10 VITALS — BP 138/92 | HR 80 | Temp 97.9°F | Resp 22 | Wt 274.0 lb

## 2017-07-10 DIAGNOSIS — M109 Gout, unspecified: Secondary | ICD-10-CM

## 2017-07-10 MED ORDER — ALLOPURINOL 100 MG PO TABS: 100.0000 mg | ORAL_TABLET | Freq: Every day | ORAL | 6 refills | Status: DC

## 2017-07-10 MED ORDER — COLCHICINE 0.6 MG PO TABS: ORAL_TABLET | ORAL | 1 refills | Status: DC

## 2017-07-10 MED FILL — ALLOPURINOL 100 MG TABS: 100 | 30 days supply | Qty: 30 | Fill #0

## 2017-07-10 MED FILL — COLCHICINE 0.6 MG TABS: 0.6 | 10 days supply | Qty: 30 | Fill #0

## 2017-07-10 NOTE — Progress Notes (Signed)
   Subjective:    Patient ID: Adam Ice., male    DOB: 1961/03/12, 57 y.o.   MRN: [id number]  HPI Patient come sin today c/o of left great toe pain. Started Monday. He has had in the past but will usually go away on its own. This time it is more painful then in the past. Rates pain 8/10. Walking increases pain and bed sheets even hurt to touch toe. Nothing really helps that much. Denies injury.    Review of Systems  Constitutional: Negative.   Respiratory: Negative.   Cardiovascular: Negative.   Musculoskeletal: Positive for arthralgias (left great toe).  Neurological: Negative.   Psychiatric/Behavioral: Negative.   All other systems reviewed and are negative.      Objective:   Physical Exam  Constitutional: He appears well-developed and well-nourished. He appears distressed (moderate).  Cardiovascular: Normal rate.  Pulmonary/Chest: Effort normal.  Musculoskeletal:  Left great toe edematous and very tender to touch  Skin: Skin is warm.  Psychiatric: He has a normal mood and affect. His behavior is normal. Judgment and thought content normal.    BP (!) 138/92 (BP Location: Right Arm, Patient Position: Sitting, Cuff Size: Normal)   Pulse 80   Temp 97.9 F (36.6 C) (Oral)   Resp (!) 22   Wt 274 lb (124.3 kg)   SpO2 99%   BMI 39.31 kg/m        Assessment & Plan:  1. Acute gout involving toe of left foot, unspecified cause Elevate when sitting Low purine diet discussed If continues will need to see PCP to have urc acid level checked. - allopurinol (ZYLOPRIM) 100 MG tablet; Take 1 tablet (100 mg total) by mouth daily.  Dispense: 30 tablet; Refill: 6 - colchicine 0.6 MG tablet; 2 at pain inset and may repeat 1 tablet in 1 hour x1 in 24 hours  Dispense: 30 tablet; Refill: [place], FNP

## 2017-07-10 NOTE — Patient Instructions (Signed)

## 2017-07-12 ENCOUNTER — Telehealth: Payer: Self-pay | Admitting: Emergency Medicine

## 2017-07-12 NOTE — Telephone Encounter (Signed)
Spoke with patients wife Adam Estrada and she stated her husband is doing much better, she stated his toe is still bothering him a little but he is better. I notified her that if her needed [place] to please feel free to give [place] a call, she stated she would let him known and thanked me for the call

## 2017-09-09 IMAGING — DX DG CHEST 2V
2 series · 2 of 2 positions shown · non-contrast
Comparison: 11/12/2012

CLINICAL DATA: Chest pressure.

EXAM:
CHEST  2 VIEW

[chest pa]
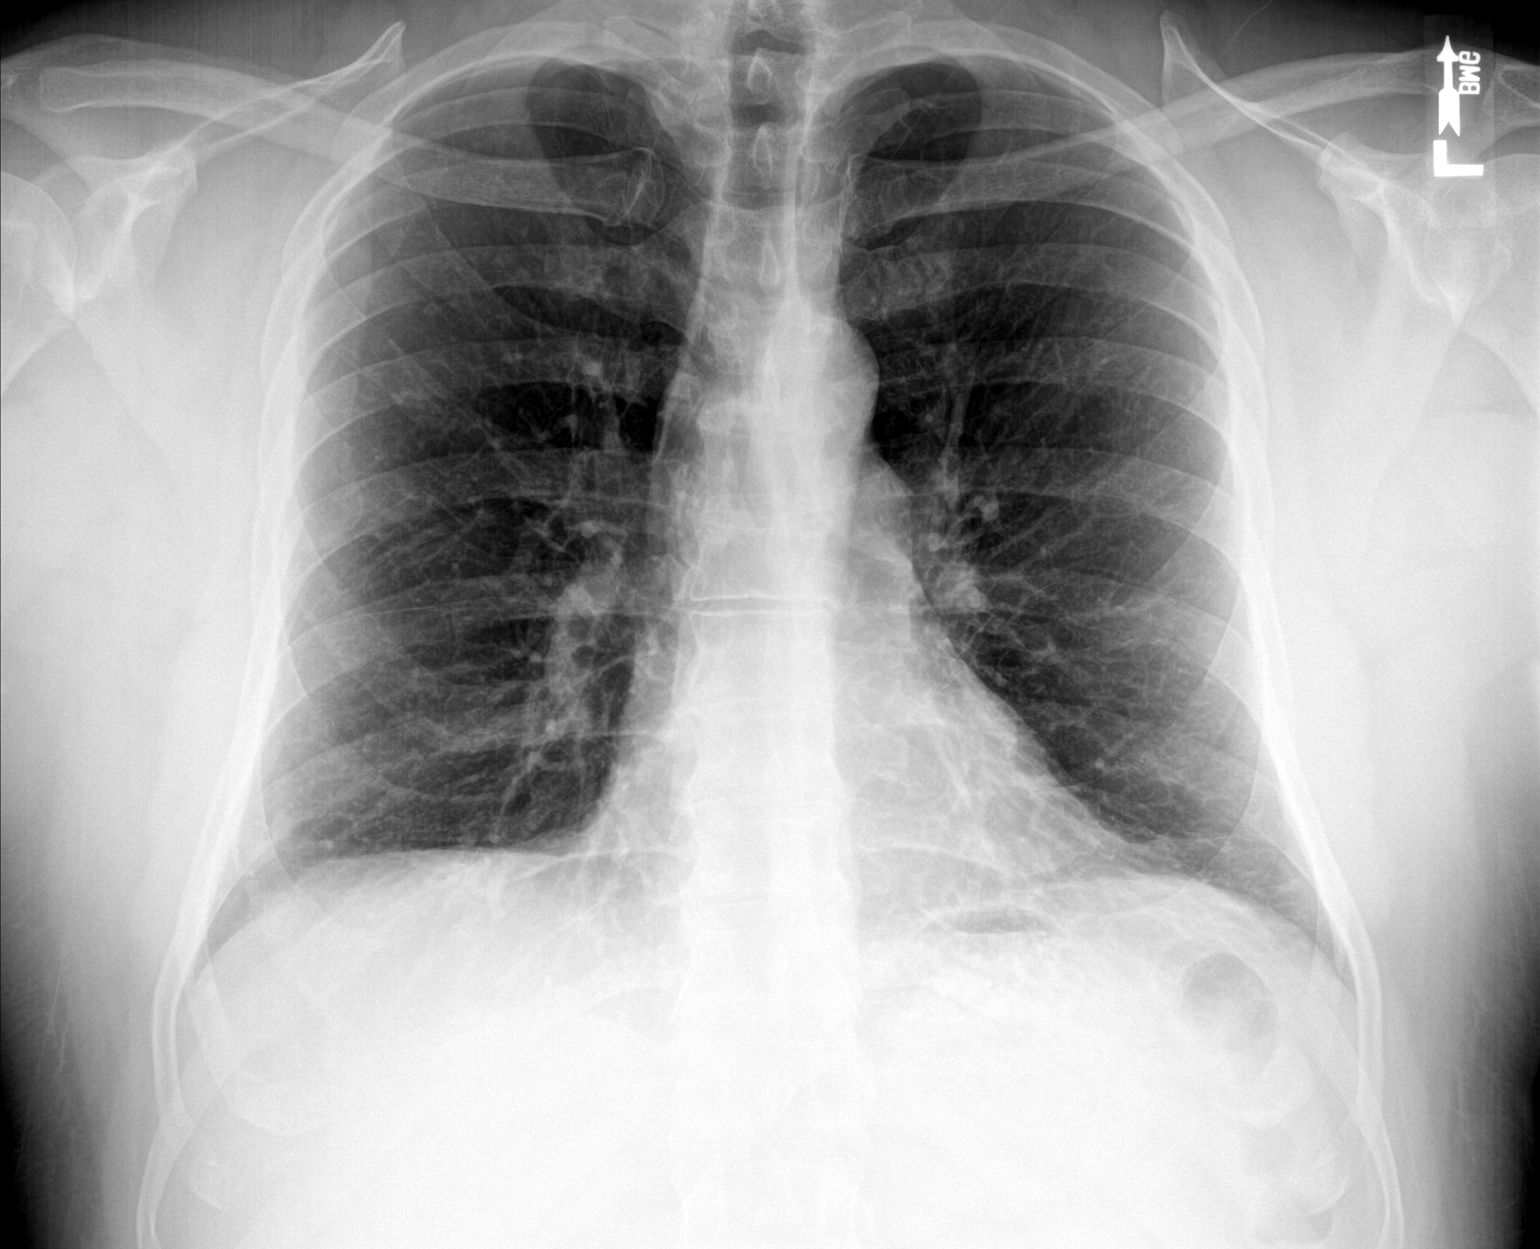

[chest lat]
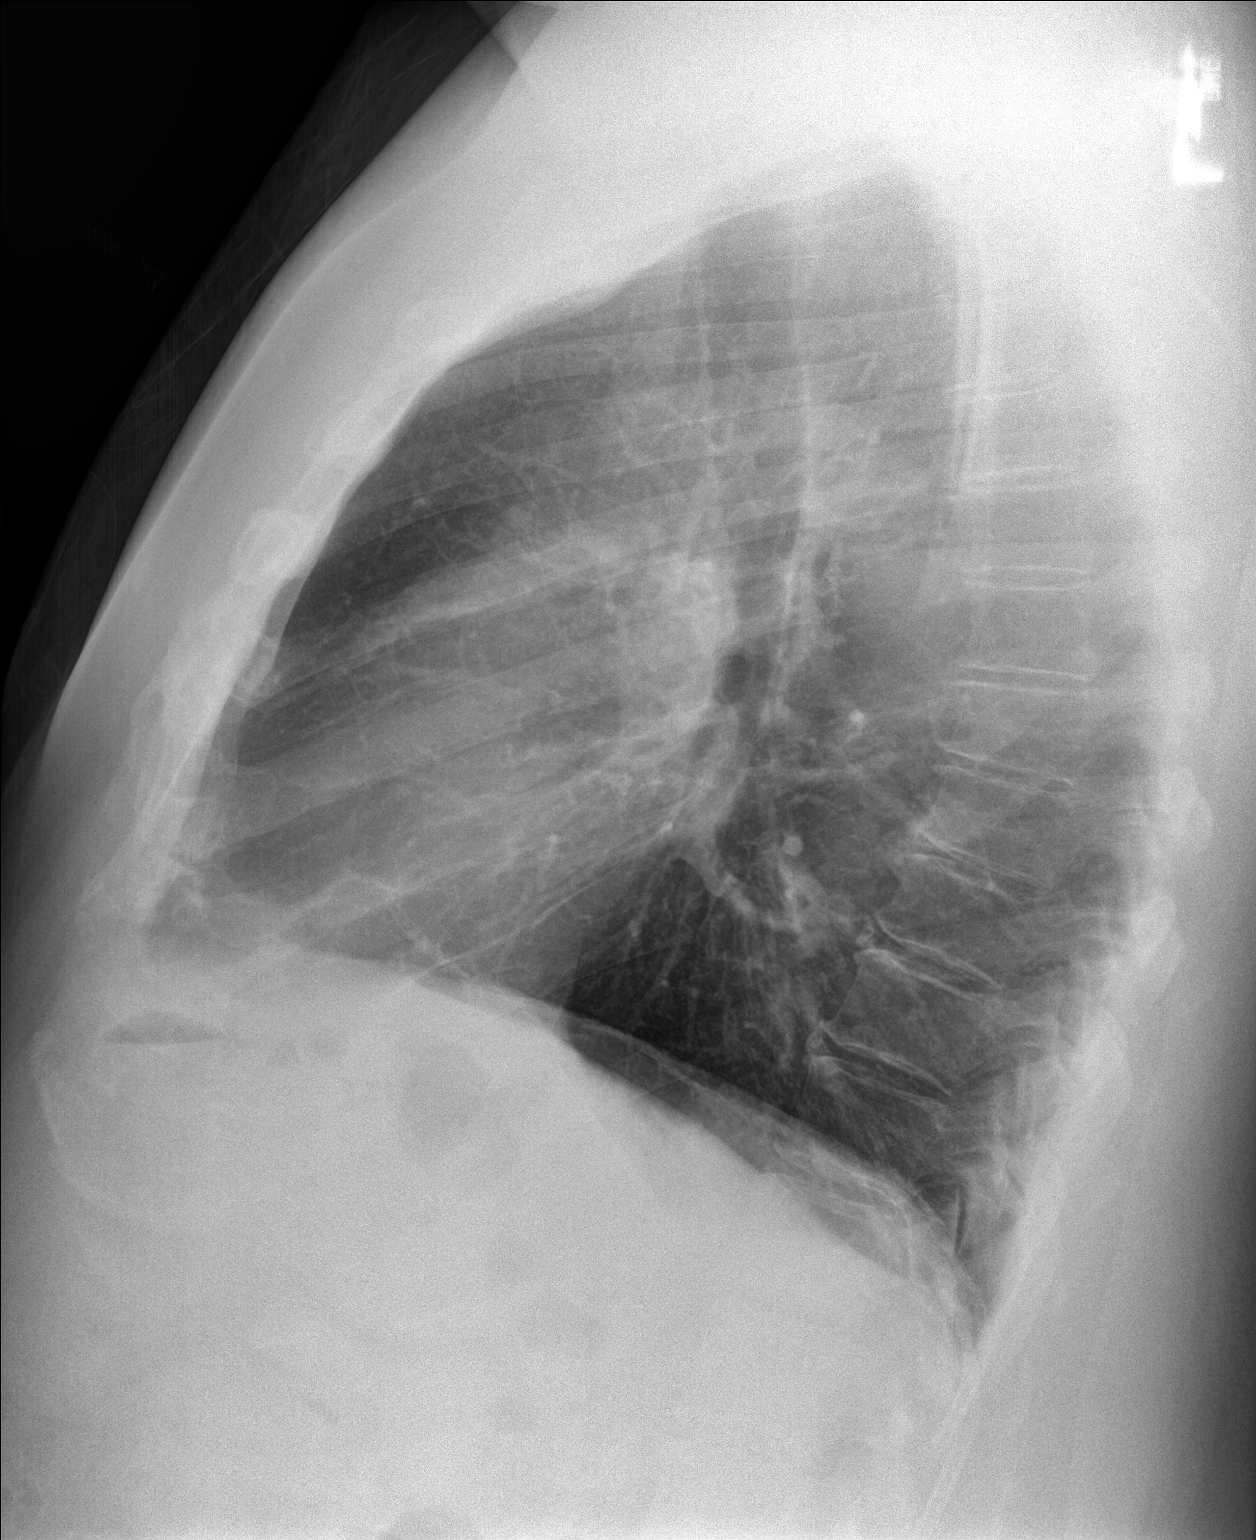

[2 of 2 positions shown; findings below may reference images not displayed]

FINDINGS: The heart size and mediastinal contours are within normal limits.
Both lungs are clear. The visualized skeletal structures are
unremarkable.
IMPRESSION: Normal exam.

## 2017-11-14 ENCOUNTER — Other Ambulatory Visit: Payer: Self-pay

## 2017-11-14 ENCOUNTER — Encounter: Payer: Self-pay | Admitting: Family Medicine

## 2017-11-14 ENCOUNTER — Ambulatory Visit (INDEPENDENT_AMBULATORY_CARE_PROVIDER_SITE_OTHER): Payer: 59 | Admitting: Family Medicine

## 2017-11-14 ENCOUNTER — Telehealth: Payer: Self-pay | Admitting: Family Medicine

## 2017-11-14 VITALS — BP 126/84 | HR 68 | Temp 97.9°F | Ht 70.0 in | Wt 271.0 lb

## 2017-11-14 DIAGNOSIS — Z0000 Encounter for general adult medical examination without abnormal findings: Secondary | ICD-10-CM | POA: Diagnosis not present

## 2017-11-14 DIAGNOSIS — Z23 Encounter for immunization: Secondary | ICD-10-CM | POA: Diagnosis not present

## 2017-11-14 DIAGNOSIS — M109 Gout, unspecified: Secondary | ICD-10-CM | POA: Diagnosis not present

## 2017-11-14 DIAGNOSIS — Z13220 Encounter for screening for lipoid disorders: Secondary | ICD-10-CM | POA: Diagnosis not present

## 2017-11-14 DIAGNOSIS — Z125 Encounter for screening for malignant neoplasm of prostate: Secondary | ICD-10-CM

## 2017-11-14 DIAGNOSIS — K625 Hemorrhage of anus and rectum: Secondary | ICD-10-CM | POA: Diagnosis not present

## 2017-11-14 DIAGNOSIS — Z1389 Encounter for screening for other disorder: Secondary | ICD-10-CM | POA: Diagnosis not present

## 2017-11-14 LAB — POCT URINALYSIS DIP (MANUAL ENTRY)
BILIRUBIN UA: NEGATIVE
BILIRUBIN UA: NEGATIVE mg/dL
GLUCOSE UA: NEGATIVE mg/dL
Leukocytes, UA: NEGATIVE
Nitrite, UA: NEGATIVE
Protein Ur, POC: NEGATIVE mg/dL
RBC UA: NEGATIVE
SPEC GRAV UA: 1.02 (ref 1.010–1.025)
Urobilinogen, UA: 0.2 E.U./dL
pH, UA: 6.5 (ref 5.0–8.0)

## 2017-11-14 MED ORDER — ZOSTER VAC RECOMB ADJUVANTED 50 MCG/0.5ML IM SUSR: 0.5000 mL | Freq: Once | INTRAMUSCULAR | 1 refills | Status: AC

## 2017-11-14 MED FILL — SHINGRIX 50 MCG SUS: 50 | 1 days supply | Qty: 1 | Fill #0

## 2017-11-14 NOTE — Progress Notes (Signed)
Subjective:  By signing my name below, I, Adam Estrada, attest that this documentation has been prepared under the direction and in the presence of Merri Ray, MD. Electronically Signed: Moises Estrada, [place]. 11/14/2017 , 3:42 PM .  Patient was seen in Room 10 .   Patient ID: Adam Estrada., male    DOB: [birth year], 57 y.o.   MRN: [id number] Chief Complaint  Patient presents with  . Annual Exam    CPE   HPI Adam Estrada. is a 57 y.o. male Here for annual physical. He has a history of gout.   Gout He has a history of gout. He has taken allopurinol and colchicine. He was seen by [facility] on February 13th for intermittent great toe pain; no Estrada work done. He has had some toe pain lately. He doesn't take allopurinol daily, only takes it when he feels some pain. He's taken colchicine about twice since February.   Cardiac He mentions following up with cardiologist, Dr. Martinique, on 03/13/17. He had exercise tolerance test which was normal, done on Dec 2018.   Rectal Bleeding He's had rectal bleeding with some rectal pain noticed after colonoscopy. Since then, he's had sporadic rectal bleeding, and believes it might be hemorrhoids occurring about once every few months and would lasts about a week. He hasn't noticed any hemorrhoids though. He denies having any rectal bleeding today. He hasn't seen GI since colonoscopy.   Cancer Screening Colonoscopy: done on Feb 2016 by Dr.Jacobs with repeat in 5 years. Removed 2 polyps.  Prostate cancer screening: elevated PSA in the past, believe done here. He's been seen by urologist, referred from here.  Lab Results  Component Value Date   PSA 2.69 08/18/2014   Most recently seen by urologist at [facility] in April 2017 for management of low testosterone and PSA. He's not currently being treated for low testosterone. PSA March 15th 2017 was 3.01.   Immunizations Immunization History  Administered Date(s) Administered  .  Influenza-Unspecified 02/25/2014  . Tdap 02/25/2013   Shingles: prescription sent to pharmacy.   Depression Depression screen [facility] 2/9 11/14/2017 01/21/2017 08/16/2014 05/17/2014  Decreased Interest 0 0 0 0  Down, Depressed, Hopeless 0 0 0 0  PHQ - 2 Score 0 0 0 0    Vision  Visual Acuity Screening   Right eye Left eye Both eyes  Without correction:     With correction: 20/15 20/15-1 20/15   He sees eye doctor annually.   Dentist He hasn't seen dentist recently.   Exercise He denies regular exercise.   Patient Active Problem List   Diagnosis Date Noted  . Chest pain 11/13/2012  . Bradycardia 11/13/2012   Past Medical History:  Diagnosis Date  . Allergy   . Bradycardia   . Chest pain   . DVT (deep venous thrombosis) ([place])   . HTN (hypertension)   . [place] fever    Past Surgical History:  Procedure Laterality Date  . ACHILLES TENDON REPAIR    . VASECTOMY     No Known Allergies Prior to Admission medications   Medication Sig Start Date End Date Taking? Authorizing Provider  allopurinol (ZYLOPRIM) 100 MG tablet Take 1 tablet (100 mg total) by mouth daily. 07/10/17   Hassell Done Mary-Margaret, FNP  azithromycin (ZITHROMAX) 250 MG tablet Take 2 tablets today, then 1 daily till finished Patient not taking: Reported on 07/10/2017 06/19/17   Barnet Glasgow, NP  benzonatate (TESSALON) 200 MG capsule Take 1 capsule (200 mg total)  by mouth 2 (two) times daily as needed for cough. Patient not taking: Reported on 07/10/2017 06/19/17   Barnet Glasgow, NP  colchicine 0.6 MG tablet 2 at pain inset and may repeat 1 tablet in 1 hour x1 in 24 hours 07/10/17   Hassell Done, Mary-Margaret, FNP  sildenafil (VIAGRA) 100 MG tablet Take 0.5-1 tablets (50-100 mg total) by mouth daily as needed for erectile dysfunction. Patient not taking: Reported on 07/10/2017 06/26/15   Wendie Agreste, MD   Social History   Socioeconomic History  . Marital status: Married    Spouse name: Not on file    . Number of children: 2  . Years of education: Not on file  . Highest education level: Not on file  Occupational History  . Occupation: Radiation protection practitioner  Social Needs  . Financial resource strain: Not on file  . Food insecurity:    Worry: Not on file    Inability: Not on file  . Transportation needs:    Medical: Not on file    Non-medical: Not on file  Tobacco Use  . Smoking status: Never Smoker  . Smokeless tobacco: Never Used  Substance and Sexual Activity  . Alcohol use: No    Alcohol/week: 0.0 oz  . Drug use: No  . Sexual activity: Yes  Lifestyle  . Physical activity:    Days per week: Not on file    Minutes per session: Not on file  . Stress: Not on file  Relationships  . Social connections:    Talks on phone: Not on file    Gets together: Not on file    Attends religious service: Not on file    Active member of club or organization: Not on file    Attends meetings of clubs or organizations: Not on file    Relationship status: Not on file  . Intimate partner violence:    Fear of current or ex partner: Not on file    Emotionally abused: Not on file    Physically abused: Not on file    Forced sexual activity: Not on file  Other Topics Concern  . Not on file  Social History Narrative   Married. Education: [employer].    Review of Systems 13 point ROS - positive for rectal bleeding, rectal pain, muscle aches, joint aches, neck pain, seasonal allergies, and skin tags    Objective:   Physical Exam  Constitutional: He is oriented to person, place, and time. He appears well-developed and well-nourished.  HENT:  Head: Normocephalic and atraumatic.  Right Ear: External ear normal.  Left Ear: External ear normal.  Mouth/Throat: Oropharynx is clear and moist.  Eyes: Pupils are equal, round, and reactive to light. Conjunctivae and EOM are normal.  Neck: Normal range of motion. Neck supple. No thyromegaly present.  Cardiovascular: Normal rate, regular  rhythm, normal heart sounds and intact distal pulses.  Pulmonary/Chest: Effort normal and breath sounds normal. No respiratory distress. He has no wheezes.  Abdominal: Soft. He exhibits no distension. There is no tenderness. Hernia confirmed negative in the right inguinal area and confirmed negative in the left inguinal area.  Genitourinary: Prostate normal.  Genitourinary Comments: Rectal exam: no fissures, no bleeding Prostate exam: no nodules  Musculoskeletal: Normal range of motion. He exhibits no edema or tenderness.  Lymphadenopathy:    He has no cervical adenopathy.  Neurological: He is alert and oriented to person, place, and time. He has normal reflexes.  Skin: Skin is warm and dry.  Multiple scattered small skin tags left side of his neck  Psychiatric: He has a normal mood and affect. His behavior is normal.  Vitals reviewed.    Vitals:   11/14/17 1500 11/14/17 1503  BP: (!) 145/94 126/84  Pulse: 68   Temp: 97.9 F (36.6 C)   TempSrc: Oral   SpO2: 96%   Weight: 271 lb (122.9 kg)   Height: 5\' 10"  (1.778 m)        Assessment & Plan:    Adam Estrada. is a 57 y.o. male Annual physical exam  - -anticipatory guidance as below in AVS, screening labs above. Health maintenance items as above in HPI discussed/recommended as applicable.   Screening for prostate cancer - Plan: PSA  - We discussed pros and cons of prostate cancer screening, and after this discussion, he chose to have screening done. PSA obtained, and no concerning findings on DRE.   Rectal bleeding  -Could be related to hemorrhoids, less likely bleeding from polyp but did note some bleeding from prior colonoscopy.  RTC precautions if persistent, but would consider gastroenterology eval as well if those symptoms continue.  Gout, unspecified cause, unspecified chronicity, unspecified site - Plan: Uric Acid  -Correct dosing of allopurinol discussed with colchicine on as-needed basis.  Check uric acid  level.   Need for shingles vaccine - Plan: Zoster Vaccine Adjuvanted [facility]) injection sent to pharmacy  Screening for hyperlipidemia - Plan: Comprehensive metabolic panel, Lipid panel  Screening for hematuria or proteinuria - Plan: POCT urinalysis dipstick without Estrada noted.   He plans to follow-up to discuss other non-urgent concerns including skin tags and myalgias.  RTC precautions if worsening sooner   Meds ordered this encounter  Medications  . Zoster Vaccine Adjuvanted [facility]) injection    Sig: Inject 0.5 mLs into the muscle once for 1 dose. Repeat in 2-6 months.    Dispense:  0.5 mL    Refill:  1   Patient Instructions   Take allopurinol daily to prevent gout flares, and I will check the uric acid level today.  Colchicine if needed for flair only.   I would recommend meeting with your gastroenterologist to discuss the rectal bleeding if that persists, but can return at the time of bleeding to see if there may be a hemorrhoid or fissure. Return to the clinic or go to the nearest emergency room if any of your symptoms worsen or new symptoms occur.  Please follow up to discuss other concerns including muscle pains and skin tags in next 1 month.  Allopurinol may help the aches if gout related.   Can follow up with myself or urologist to discuss testosterone, but would like to see PSA results first.   Please schedule dentist appointment.   Thank you for coming in today.    Rectal Bleeding Rectal bleeding is when Estrada passes out of the anus. People with rectal bleeding may notice bright red Estrada in their underwear or in the toilet after having a bowel movement. They may also have dark red or black stools. Rectal bleeding is usually a sign that something is wrong. Many things can cause rectal bleeding, including:  Hemorrhoids. These are Estrada vessels in the anus or rectum that are larger than normal.  Fistulas. These are abnormal passages in the rectum and  anus.  Anal fissures. This is a tear in the anus.  Diverticulosis. This is a condition in which pockets or sacs project from the bowel.  Proctitis and colitis. These are  conditions in which the rectum, colon, or anus become inflamed.  Polyps. These are growths that can be cancerous (malignant) or non-cancerous (benign).  Part of the rectum sticking out from the anus (rectal prolapse).  Certain medicines.  Intestinal infections.  Follow these instructions at home: Pay attention to any changes in your symptoms. Take these actions to help lessen bleeding and discomfort:  Eat a diet that is high in fiber. This will keep your stool soft, making it easier to pass stools without straining. Ask your health care provider what foods and drinks are high in fiber.  Drink enough fluid to keep your urine clear or pale yellow. This also helps to keep your stool soft.  Try taking a warm bath. This may help soothe any pain in your rectum.  Keep all follow-up visits as told by your health care provider. This is important.  Get help right away if:  You have new or increased rectal bleeding.  You have black or dark red stools.  You vomit Estrada or something that looks like coffee grounds.  You have pain or tenderness in your abdomen.  You have a fever.  You feel weak.  You feel nauseous.  You faint.  You have severe pain in your rectum.  You cannot have a bowel movement. This information is not intended to replace advice given to you by your health care provider. Make sure you discuss any questions you have with your health care provider. Document Released: 11/03/2001 Document Revised: 10/20/2015 Document Reviewed: 07/10/2015 Elsevier Interactive Patient Education  2018 [place] you healthy  Get these tests  Estrada pressure- Have your Estrada pressure checked once a year by your healthcare provider.  Normal Estrada pressure is 120/80  Weight- Have your body mass index  (BMI) calculated to screen for obesity.  BMI is a measure of body fat based on height and weight. You can also calculate your own BMI at ViewBanking.si.  Cholesterol- Have your cholesterol checked every year.  Diabetes- Have your Estrada sugar checked regularly if you have high Estrada pressure, high cholesterol, have a family history of diabetes or if you are overweight.  Screening for Colon Cancer- Colonoscopy starting at age 9.  Screening may begin sooner depending on your family history and other health conditions. Follow up colonoscopy as directed by your Gastroenterologist.  Screening for Prostate Cancer- Both Estrada work (PSA) and a rectal exam help screen for Prostate Cancer.  Screening begins at age 44 with African-American men and at age 41 with Caucasian men.  Screening may begin sooner depending on your family history.   Take these medicines  Flu shot- Every fall.  Tetanus- Every 10 years.  Zostavax- Once after the age of 59 to prevent Shingles.  Pneumonia shot- Once after the age of 70; if you are younger than 28, ask your healthcare provider if you need a Pneumonia shot.  Take these steps  Don't smoke- If you do smoke, talk to your doctor about quitting.  For tips on how to quit, go to www.smokefree.gov or call 1-800-QUIT-NOW.  Be physically active- Exercise 5 days a week for at least 30 minutes.  If you are not already physically active start slow and gradually work up to 30 minutes of moderate physical activity.  Examples of moderate activity include walking briskly, mowing the yard, dancing, swimming, bicycling, etc.  Eat a healthy diet- Eat a variety of healthy food such as fruits, vegetables, low fat milk, low fat cheese, yogurt, lean  meant, poultry, fish, beans, tofu, etc. For more information go to www.thenutritionsource.org  Drink alcohol in moderation- Limit alcohol intake to less than two drinks a day. Never drink and drive.  Dentist- Brush and floss twice  daily; visit your dentist twice a year.  Depression- Your emotional health is as important as your physical health. If you're feeling down, or losing interest in things you would normally enjoy please talk to your healthcare provider.  Eye exam- Visit your eye doctor every year.  Safe sex- If you may be exposed to a sexually transmitted infection, use a condom.  Seat belts- Seat belts can save your life; always wear one.  Smoke/Carbon Monoxide detectors- These detectors need to be installed on the appropriate level of your home.  Replace batteries at least once a year.  Skin cancer- When out in the sun, cover up and use sunscreen 15 SPF or higher.  Violence- If anyone is threatening you, please tell your healthcare provider.  Living Will/ Health care power of attorney- Speak with your healthcare provider and family.   Gout Gout is painful swelling that can occur in some of your joints. Gout is a type of arthritis. This condition is caused by having too much uric acid in your body. Uric acid is a chemical that forms when your body breaks down substances called purines. Purines are important for building body proteins. When your body has too much uric acid, sharp crystals can form and build up inside your joints. This causes pain and swelling. Gout attacks can happen quickly and be very painful (acute gout). Over time, the attacks can affect more joints and become more frequent (chronic gout). Gout can also cause uric acid to build up under your skin and inside your kidneys. What are the causes? This condition is caused by too much uric acid in your Estrada. This can occur because:  Your kidneys do not remove enough uric acid from your Estrada. This is the most common cause.  Your body makes too much uric acid. This can occur with some cancers and cancer treatments. It can also occur if your body is breaking down too many red Estrada cells (hemolytic anemia).  You eat too many foods that are  high in purines. These foods include organ meats and some seafood. Alcohol, especially beer, is also high in purines.  A gout attack may be triggered by trauma or stress. What increases the risk? This condition is more likely to develop in people who:  Have a family history of gout.  Are male and middle-aged.  Are male and have gone through menopause.  Are obese.  Frequently drink alcohol, especially beer.  Are dehydrated.  Lose weight too quickly.  Have an organ transplant.  Have lead poisoning.  Take certain medicines, including aspirin, cyclosporine, diuretics, levodopa, and niacin.  Have kidney disease or psoriasis.  What are the signs or symptoms? An attack of acute gout happens quickly. It usually occurs in just one joint. The most common place is the big toe. Attacks often start at night. Other joints that may be affected include joints of the feet, ankle, knee, fingers, wrist, or elbow. Symptoms may include:  Severe pain.  Warmth.  Swelling.  Stiffness.  Tenderness. The affected joint may be very painful to touch.  Shiny, red, or purple skin.  Chills and fever.  Chronic gout may cause symptoms more frequently. More joints may be involved. You may also have white or yellow lumps (tophi) on your hands or  feet or in other areas near your joints. How is this diagnosed? This condition is diagnosed based on your symptoms, medical history, and physical exam. You may have tests, such as:  Estrada tests to measure uric acid levels.  Removal of joint fluid with a needle (aspiration) to look for uric acid crystals.  X-rays to look for joint damage.  How is this treated? Treatment for this condition has two phases: treating an acute attack and preventing future attacks. Acute gout treatment may include medicines to reduce pain and swelling, including:  NSAIDs.  Steroids. These are strong anti-inflammatory medicines that can be taken by mouth (orally) or  injected into a joint.  Colchicine. This medicine relieves pain and swelling when it is taken soon after an attack. It can be given orally or through an IV tube.  Preventive treatment may include:  Daily use of smaller doses of NSAIDs or colchicine.  Use of a medicine that reduces uric acid levels in your Estrada.  Changes to your diet. You may need to see a specialist about healthy eating (dietitian).  Follow these instructions at home: During a Gout Attack  If directed, apply ice to the affected area: ? Put ice in a plastic bag. ? Place a towel between your skin and the bag. ? Leave the ice on for 20 minutes, 2-3 times a day.  Rest the joint as much as possible. If the affected joint is in your leg, you may be given crutches to use.  Raise (elevate) the affected joint above the level of your heart as often as possible.  Drink enough fluids to keep your urine clear or pale yellow.  Take over-the-counter and prescription medicines only as told by your health care provider.  Do not drive or operate heavy machinery while taking prescription pain medicine.  Follow instructions from your health care provider about eating or drinking restrictions.  Return to your normal activities as told by your health care provider. Ask your health care provider what activities are safe for you. Avoiding Future Gout Attacks  Follow a low-purine diet as told by your dietitian or health care provider. Avoid foods and drinks that are high in purines, including liver, kidney, anchovies, asparagus, herring, mushrooms, mussels, and beer.  Limit alcohol intake to no more than 1 drink a day for nonpregnant women and 2 drinks a day for men. One drink equals 12 oz of beer, 5 oz of wine, or 1 oz of hard liquor.  Maintain a healthy weight or lose weight if you are overweight. If you want to lose weight, talk with your health care provider. It is important that you do not lose weight too quickly.  Start or  maintain an exercise program as told by your health care provider.  Drink enough fluids to keep your urine clear or pale yellow.  Take over-the-counter and prescription medicines only as told by your health care provider.  Keep all follow-up visits as told by your health care provider. This is important. Contact a health care provider if:  You have another gout attack.  You continue to have symptoms of a gout attack after10 days of treatment.  You have side effects from your medicines.  You have chills or a fever.  You have burning pain when you urinate.  You have pain in your lower back or belly. Get help right away if:  You have severe or uncontrolled pain.  You cannot urinate. This information is not intended to replace advice given to  you by your health care provider. Make sure you discuss any questions you have with your health care provider. Document Released: 05/11/2000 Document Revised: 10/20/2015 Document Reviewed: 02/24/2015 Elsevier Interactive Patient Education  2018 [employer].   IF you received an x-ray today, you will receive an invoice from [facility]. Please contact [facility] at [phone] with questions or concerns regarding your invoice.   IF you received labwork today, you will receive an invoice from [facility]. Please contact [facility] at [phone] with questions or concerns regarding your invoice.   Our billing staff will not be able to assist you with questions regarding bills from these companies.  You will be contacted with the lab results as soon as they are available. The fastest way to get your results is to activate your My Chart account. Instructions are located on the last page of this paperwork. If you have not heard from [place] regarding the results in 2 weeks, please contact this office.       I personally performed the services described in this documentation, which was scribed in my presence. The recorded information  has been reviewed and considered for accuracy and completeness, addended by me as needed, and agree with information above.  Signed,   Merri Ray, MD [facility].  11/17/17 10:10 PM

## 2017-11-14 NOTE — Patient Instructions (Addendum)
Take allopurinol daily to prevent gout flares, and I will check the uric acid level today.  Colchicine if needed for flair only.   I would recommend meeting with your gastroenterologist to discuss the rectal bleeding if that persists, but can return at the time of bleeding to see if there may be a hemorrhoid or fissure. Return to the clinic or go to the nearest emergency room if any of your symptoms worsen or new symptoms occur.  Please follow up to discuss other concerns including muscle pains and skin tags in next 1 month.  Allopurinol may help the aches if gout related.   Can follow up with myself or urologist to discuss testosterone, but would like to see PSA results first.   Please schedule dentist appointment.   Thank you for coming in today.    Rectal Bleeding Rectal bleeding is when blood passes out of the anus. People with rectal bleeding may notice bright red blood in their underwear or in the toilet after having a bowel movement. They may also have dark red or black stools. Rectal bleeding is usually a sign that something is wrong. Many things can cause rectal bleeding, including:  Hemorrhoids. These are blood vessels in the anus or rectum that are larger than normal.  Fistulas. These are abnormal passages in the rectum and anus.  Anal fissures. This is a tear in the anus.  Diverticulosis. This is a condition in which pockets or sacs project from the bowel.  Proctitis and colitis. These are conditions in which the rectum, colon, or anus become inflamed.  Polyps. These are growths that can be cancerous (malignant) or non-cancerous (benign).  Part of the rectum sticking out from the anus (rectal prolapse).  Certain medicines.  Intestinal infections.  Follow these instructions at home: Pay attention to any changes in your symptoms. Take these actions to help lessen bleeding and discomfort:  Eat a diet that is high in fiber. This will keep your stool soft, making it  easier to pass stools without straining. Ask your health care provider what foods and drinks are high in fiber.  Drink enough fluid to keep your urine clear or pale yellow. This also helps to keep your stool soft.  Try taking a warm bath. This may help soothe any pain in your rectum.  Keep all follow-up visits as told by your health care provider. This is important.  Get help right away if:  You have new or increased rectal bleeding.  You have black or dark red stools.  You vomit blood or something that looks like coffee grounds.  You have pain or tenderness in your abdomen.  You have a fever.  You feel weak.  You feel nauseous.  You faint.  You have severe pain in your rectum.  You cannot have a bowel movement. This information is not intended to replace advice given to you by your health care provider. Make sure you discuss any questions you have with your health care provider. Document Released: 11/03/2001 Document Revised: 10/20/2015 Document Reviewed: 07/10/2015 Elsevier Interactive Patient Education  2018 [place] you healthy  Get these tests  Blood pressure- Have your blood pressure checked once a year by your healthcare provider.  Normal blood pressure is 120/80  Weight- Have your body mass index (BMI) calculated to screen for obesity.  BMI is a measure of body fat based on height and weight. You can also calculate your own BMI at [email or web address].  Cholesterol- Have your cholesterol  checked every year.  Diabetes- Have your blood sugar checked regularly if you have high blood pressure, high cholesterol, have a family history of diabetes or if you are overweight.  Screening for Colon Cancer- Colonoscopy starting at age 16.  Screening may begin sooner depending on your family history and other health conditions. Follow up colonoscopy as directed by your Gastroenterologist.  Screening for Prostate Cancer- Both blood work (PSA) and a rectal  exam help screen for Prostate Cancer.  Screening begins at age 49 with African-American men and at age 21 with Caucasian men.  Screening may begin sooner depending on your family history.   Take these medicines  Flu shot- Every fall.  Tetanus- Every 10 years.  Zostavax- Once after the age of 26 to prevent Shingles.  Pneumonia shot- Once after the age of 59; if you are younger than 70, ask your healthcare provider if you need a Pneumonia shot.  Take these steps  Don't smoke- If you do smoke, talk to your doctor about quitting.  For tips on how to quit, go to www.smokefree.gov or call 1-800-QUIT-NOW.  Be physically active- Exercise 5 days a week for at least 30 minutes.  If you are not already physically active start slow and gradually work up to 30 minutes of moderate physical activity.  Examples of moderate activity include walking briskly, mowing the yard, dancing, swimming, bicycling, etc.  Eat a healthy diet- Eat a variety of healthy food such as fruits, vegetables, low fat milk, low fat cheese, yogurt, lean meant, poultry, fish, beans, tofu, etc. For more information go to www.thenutritionsource.org  Drink alcohol in moderation- Limit alcohol intake to less than two drinks a day. Never drink and drive.  Dentist- Brush and floss twice daily; visit your dentist twice a year.  Depression- Your emotional health is as important as your physical health. If you're feeling down, or losing interest in things you would normally enjoy please talk to your healthcare provider.  Eye exam- Visit your eye doctor every year.  Safe sex- If you may be exposed to a sexually transmitted infection, use a condom.  Seat belts- Seat belts can save your life; always wear one.  Smoke/Carbon Monoxide detectors- These detectors need to be installed on the appropriate level of your home.  Replace batteries at least once a year.  Skin cancer- When out in the sun, cover up and use sunscreen 15 SPF or  higher.  Violence- If anyone is threatening you, please tell your healthcare provider.  Living Will/ Health care power of attorney- Speak with your healthcare provider and family.   Gout Gout is painful swelling that can occur in some of your joints. Gout is a type of arthritis. This condition is caused by having too much uric acid in your body. Uric acid is a chemical that forms when your body breaks down substances called purines. Purines are important for building body proteins. When your body has too much uric acid, sharp crystals can form and build up inside your joints. This causes pain and swelling. Gout attacks can happen quickly and be very painful (acute gout). Over time, the attacks can affect more joints and become more frequent (chronic gout). Gout can also cause uric acid to build up under your skin and inside your kidneys. What are the causes? This condition is caused by too much uric acid in your blood. This can occur because:  Your kidneys do not remove enough uric acid from your blood. This is the most common  cause.  Your body makes too much uric acid. This can occur with some cancers and cancer treatments. It can also occur if your body is breaking down too many red blood cells (hemolytic anemia).  You eat too many foods that are high in purines. These foods include organ meats and some seafood. Alcohol, especially beer, is also high in purines.  A gout attack may be triggered by trauma or stress. What increases the risk? This condition is more likely to develop in people who:  Have a family history of gout.  Are male and middle-aged.  Are male and have gone through menopause.  Are obese.  Frequently drink alcohol, especially beer.  Are dehydrated.  Lose weight too quickly.  Have an organ transplant.  Have lead poisoning.  Take certain medicines, including aspirin, cyclosporine, diuretics, levodopa, and niacin.  Have kidney disease or psoriasis.  What  are the signs or symptoms? An attack of acute gout happens quickly. It usually occurs in just one joint. The most common place is the big toe. Attacks often start at night. Other joints that may be affected include joints of the feet, ankle, knee, fingers, wrist, or elbow. Symptoms may include:  Severe pain.  Warmth.  Swelling.  Stiffness.  Tenderness. The affected joint may be very painful to touch.  Shiny, red, or purple skin.  Chills and fever.  Chronic gout may cause symptoms more frequently. More joints may be involved. You may also have white or yellow lumps (tophi) on your hands or feet or in other areas near your joints. How is this diagnosed? This condition is diagnosed based on your symptoms, medical history, and physical exam. You may have tests, such as:  Blood tests to measure uric acid levels.  Removal of joint fluid with a needle (aspiration) to look for uric acid crystals.  X-rays to look for joint damage.  How is this treated? Treatment for this condition has two phases: treating an acute attack and preventing future attacks. Acute gout treatment may include medicines to reduce pain and swelling, including:  NSAIDs.  Steroids. These are strong anti-inflammatory medicines that can be taken by mouth (orally) or injected into a joint.  Colchicine. This medicine relieves pain and swelling when it is taken soon after an attack. It can be given orally or through an IV tube.  Preventive treatment may include:  Daily use of smaller doses of NSAIDs or colchicine.  Use of a medicine that reduces uric acid levels in your blood.  Changes to your diet. You may need to see a specialist about healthy eating (dietitian).  Follow these instructions at home: During a Gout Attack  If directed, apply ice to the affected area: ? Put ice in a plastic bag. ? Place a towel between your skin and the bag. ? Leave the ice on for 20 minutes, 2-3 times a day.  Rest the joint  as much as possible. If the affected joint is in your leg, you may be given crutches to use.  Raise (elevate) the affected joint above the level of your heart as often as possible.  Drink enough fluids to keep your urine clear or pale yellow.  Take over-the-counter and prescription medicines only as told by your health care provider.  Do not drive or operate heavy machinery while taking prescription pain medicine.  Follow instructions from your health care provider about eating or drinking restrictions.  Return to your normal activities as told by your health care provider. Ask your  health care provider what activities are safe for you. Avoiding Future Gout Attacks  Follow a low-purine diet as told by your dietitian or health care provider. Avoid foods and drinks that are high in purines, including liver, kidney, anchovies, asparagus, herring, mushrooms, mussels, and beer.  Limit alcohol intake to no more than 1 drink a day for nonpregnant women and 2 drinks a day for men. One drink equals 12 oz of beer, 5 oz of wine, or 1 oz of hard liquor.  Maintain a healthy weight or lose weight if you are overweight. If you want to lose weight, talk with your health care provider. It is important that you do not lose weight too quickly.  Start or maintain an exercise program as told by your health care provider.  Drink enough fluids to keep your urine clear or pale yellow.  Take over-the-counter and prescription medicines only as told by your health care provider.  Keep all follow-up visits as told by your health care provider. This is important. Contact a health care provider if:  You have another gout attack.  You continue to have symptoms of a gout attack after10 days of treatment.  You have side effects from your medicines.  You have chills or a fever.  You have burning pain when you urinate.  You have pain in your lower back or belly. Get help right away if:  You have severe or  uncontrolled pain.  You cannot urinate. This information is not intended to replace advice given to you by your health care provider. Make sure you discuss any questions you have with your health care provider. Document Released: 05/11/2000 Document Revised: 10/20/2015 Document Reviewed: 02/24/2015 Elsevier Interactive Patient Education  2018 [employer].   IF you received an x-ray today, you will receive an invoice from [facility]. Please contact [facility] at [phone] with questions or concerns regarding your invoice.   IF you received labwork today, you will receive an invoice from [facility]. Please contact [facility] at [phone] with questions or concerns regarding your invoice.   Our billing staff will not be able to assist you with questions regarding bills from these companies.  You will be contacted with the lab results as soon as they are available. The fastest way to get your results is to activate your My Chart account. Instructions are located on the last page of this paperwork. If you have not heard from [place] regarding the results in 2 weeks, please contact this office.

## 2017-11-14 NOTE — Telephone Encounter (Signed)
Patient just had a physical earlier today and forgot to ask for a refill on his sildenafil (VIAGRA and wants to know if a refill can be called in the [facility]

## 2017-11-15 LAB — COMPREHENSIVE METABOLIC PANEL
A/G RATIO: 1.6 (ref 1.2–2.2)
ALBUMIN: 4.3 g/dL (ref 3.5–5.5)
ALT: 31 IU/L (ref 0–44)
AST: 19 IU/L (ref 0–40)
Alkaline Phosphatase: 109 IU/L (ref 39–117)
BILIRUBIN TOTAL: 0.3 mg/dL (ref 0.0–1.2)
BUN / CREAT RATIO: 11 (ref 9–20)
BUN: 12 mg/dL (ref 6–24)
CHLORIDE: 104 mmol/L (ref 96–106)
CO2: 22 mmol/L (ref 20–29)
Calcium: 9.6 mg/dL (ref 8.7–10.2)
Creatinine, Ser: 1.08 mg/dL (ref 0.76–1.27)
GFR calc non Af Amer: 77 mL/min/{1.73_m2} (ref >59)
GFR, EST AFRICAN AMERICAN: 89 mL/min/{1.73_m2} (ref >59)
GLOBULIN, TOTAL: 2.7 g/dL (ref 1.5–4.5)
Glucose: 86 mg/dL (ref 65–99)
POTASSIUM: 4.5 mmol/L (ref 3.5–5.2)
Sodium: 141 mmol/L (ref 134–144)
TOTAL PROTEIN: 7 g/dL (ref 6.0–8.5)

## 2017-11-15 LAB — LIPID PANEL
Chol/HDL Ratio: 4 ratio (ref 0.0–5.0)
Cholesterol, Total: 141 mg/dL (ref 100–199)
HDL: 35 mg/dL — ABNORMAL LOW (ref >39)
LDL Calculated: 88 mg/dL (ref 0–99)
Triglycerides: 91 mg/dL (ref 0–149)
VLDL CHOLESTEROL CAL: 18 mg/dL (ref 5–40)

## 2017-11-15 LAB — URIC ACID: Uric Acid: 6.5 mg/dL (ref 3.7–8.6)

## 2017-11-15 LAB — PSA: Prostate Specific Ag, Serum: 4.5 ng/mL — ABNORMAL HIGH (ref 0.0–4.0)

## 2017-11-15 MED FILL — ALLOPURINOL 100 MG TABLET: 100 | 30 days supply | Qty: 30 | Fill #1

## 2017-11-21 ENCOUNTER — Encounter: Payer: Self-pay | Admitting: Family Medicine

## 2017-11-21 NOTE — Telephone Encounter (Signed)
Message re: request for ED meds sent to [facility].

## 2017-11-27 MED ORDER — TADALAFIL 20 MG PO TABS: 10.0000 mg | ORAL_TABLET | ORAL | 11 refills | Status: DC | PRN

## 2017-12-06 MED FILL — TADALAFIL 20 MG TABS: 20 | 30 days supply | Qty: 5 | Fill #0

## 2017-12-16 ENCOUNTER — Ambulatory Visit: Payer: 59 | Admitting: Family Medicine

## 2018-01-14 ENCOUNTER — Encounter: Payer: Self-pay | Admitting: Family Medicine

## 2018-01-14 ENCOUNTER — Other Ambulatory Visit: Payer: Self-pay

## 2018-01-14 ENCOUNTER — Ambulatory Visit: Payer: 59 | Admitting: Family Medicine

## 2018-01-14 VITALS — BP 124/78 | HR 65 | Temp 98.4°F | Ht 70.0 in | Wt 274.0 lb

## 2018-01-14 DIAGNOSIS — M791 Myalgia, unspecified site: Secondary | ICD-10-CM

## 2018-01-14 DIAGNOSIS — R972 Elevated prostate specific antigen [PSA]: Secondary | ICD-10-CM

## 2018-01-14 DIAGNOSIS — Z1321 Encounter for screening for nutritional disorder: Secondary | ICD-10-CM

## 2018-01-14 DIAGNOSIS — L918 Other hypertrophic disorders of the skin: Secondary | ICD-10-CM

## 2018-01-14 DIAGNOSIS — M2550 Pain in unspecified joint: Secondary | ICD-10-CM

## 2018-01-14 NOTE — Progress Notes (Signed)
Subjective:  By signing my name below, I, Essence Howell, attest that this documentation has been prepared under the direction and in the presence of Wendie Agreste, MD Electronically Signed: Ladene Artist, ED Scribe 01/14/2018 at 11:00 AM.   Patient ID: Adam Ice., male    DOB: 10/19/1960, 57 y.o.   MRN: [id number]  Chief Complaint  Patient presents with  . remoal of skin tags  . muscle soreness    off and on   HPI Adam Estrada. is a 57 y.o. male who presents to Primary Care at [facility] for f/u. Last seen 6/20 for CPE and other concerns.  Episodic Myalgias Briefly discussed last OV. Not currently on statin. - Pt reports myalgias in his calves with prolonged sitting, neck, shoulders and fingers. He does report more myalgias at night after heavy lifting at work. H/o achilles rupture on the L ~4-5 yrs ago. H/o Gout, last uric acid test was 6.5 in June. Takes allopurinol 100 mg qd. Has not needed to take colchicine lately. Pt is not currently taking any supplements. No meds tried PTA.  Skin Tags Multiple scattered small skin tags on L side of neck when examined at CPE. - Pt would like to have the larger 4 skin tags removed at this visit as they become irritated when they rub against his shirt. Denies any changes in size or color.  Elevated PSA Lab Results  Component Value Date   PSA 2.69 08/18/2014  4.5 on 6/20, increased from 2.69 in 2016. Recommended f/u with urology as he had seen them prev with referral if needed. Last urology note on 08/2015 with Dr. Dayna Ramus, PSA was 3.01 on 3/15. Treated with testosterone therapy at that time which he is not currently taking. Plan was to f/u in 3 months. - Pt has not scheduled an appointment with urology since last OV. Denies fever, night sweats, unexplained weight loss, hematuria, difficulty urinating.  Patient Active Problem List   Diagnosis Date Noted  . Chest pain 11/13/2012  . Bradycardia 11/13/2012   Past Medical History:    Diagnosis Date  . Allergy   . Bradycardia   . Chest pain   . DVT (deep venous thrombosis) ([place])   . HTN (hypertension)   . Gilman fever    Past Surgical History:  Procedure Laterality Date  . ACHILLES TENDON REPAIR    . VASECTOMY     No Known Allergies Prior to Admission medications   Medication Sig Start Date End Date Taking? Authorizing Provider  allopurinol (ZYLOPRIM) 100 MG tablet Take 1 tablet (100 mg total) by mouth daily. 07/10/17  Yes Hassell Done, Mary-Margaret, FNP  colchicine 0.6 MG tablet 2 at pain inset and may repeat 1 tablet in 1 hour x1 in 24 hours 07/10/17  Yes Hassell Done, Mary-Margaret, FNP  sildenafil (VIAGRA) 100 MG tablet Take 0.5-1 tablets (50-100 mg total) by mouth daily as needed for erectile dysfunction. 06/26/15  Yes Wendie Agreste, MD   Social History   Socioeconomic History  . Marital status: Married    Spouse name: Not on file  . Number of children: 2  . Years of education: Not on file  . Highest education level: Not on file  Occupational History  . Occupation: Radiation protection practitioner  Social Needs  . Financial resource strain: Not on file  . Food insecurity:    Worry: Not on file    Inability: Not on file  . Transportation needs:    Medical: Not on file  Non-medical: Not on file  Tobacco Use  . Smoking status: Never Smoker  . Smokeless tobacco: Never Used  Substance and Sexual Activity  . Alcohol use: No    Alcohol/week: 0.0 standard drinks  . Drug use: No  . Sexual activity: Yes  Lifestyle  . Physical activity:    Days per week: Not on file    Minutes per session: Not on file  . Stress: Not on file  Relationships  . Social connections:    Talks on phone: Not on file    Gets together: Not on file    Attends religious service: Not on file    Active member of club or organization: Not on file    Attends meetings of clubs or organizations: Not on file    Relationship status: Not on file  . Intimate partner violence:    Fear of  current or ex partner: Not on file    Emotionally abused: Not on file    Physically abused: Not on file    Forced sexual activity: Not on file  Other Topics Concern  . Not on file  Social History Narrative   Married. Education: [employer].    Review of Systems  Constitutional: Negative for diaphoresis, fever and unexpected weight change.  Genitourinary: Negative for difficulty urinating and hematuria.  Musculoskeletal: Positive for arthralgias and myalgias.  Skin: Negative for color change.      Objective:   Physical Exam  Constitutional: He is oriented to person, place, and time. He appears well-developed and well-nourished. No distress.  HENT:  Head: Normocephalic and atraumatic.  Eyes: Conjunctivae and EOM are normal.  Neck: Neck supple. No tracheal deviation present.  Cardiovascular: Normal rate.  Pulmonary/Chest: Effort normal. No respiratory distress.  Musculoskeletal: Normal range of motion.  Slight tenderness along distal achilles without defect. Minimal discomfort on plantar flexion on L, R no pain with flexion. Shoulders: FROM Knees: equal ROM  Neurological: He is alert and oriented to person, place, and time.  Skin: Skin is warm and dry.  L neck: 6 very small skin tags and 4 larger tags, largest being ~7 mm across x 1 cm tall with narrowed base of attachment at 3 mm. Other 2 larger ones are ~4-5 mm. No surrounding erythema or skin changes.  Psychiatric: He has a normal mood and affect. His behavior is normal.  Nursing note and vitals reviewed.  Vitals:   01/14/18 1031  BP: 124/78  Pulse: 65  Temp: 98.4 F (36.9 C)  TempSrc: Oral  SpO2: 98%  Weight: 274 lb (124.3 kg)  Height: 5\' 10"  (1.778 m)  Risks (including but not limited to bleeding and infection), benefits, and alternatives discussed for skin tag removal. .  Verbal consent obtained after any questions were answered. Landmarks noted, and marked as needed. Area cleansed with Betadine ethyl chloride spray  for topical anesthesia, followed by alcohol swab for each lesion. 3 lesions on left neck removed. No bleeding on 2 smaller lesions, bandage applied. Minimal oozing on larger lesion - pressure applied then silver nitrate stick x 1.  No complications. Bandage applied.  RTC precautions discussed and aftercare.      Assessment & Plan:   Deo Mehringer. is a 57 y.o. male Myalgia - Plan: CK, VITAMIN D 25 Hydroxy (Vit-D Deficiency, Fractures) Encounter for vitamin deficiency screening - Plan: VITAMIN D 25 Hydroxy (Vit-D Deficiency, Fractures) Arthralgia, unspecified joint - Plan: CK, VITAMIN D 25 Hydroxy (Vit-D Deficiency, Fractures)  -Polyarthralgia, likely related to some of  his physical work, previous Achilles issues, possible Achilles tendinitis on the left, but will also check CPK level and vitamin D although less likely causes.  Episodic Tylenol discussed, maintain hydration, stretching, and if one specific area swollen or sore return for recheck as differential includes gout.  Would consider increasing allopurinol dose based on last uric acid level if he has recurrence of gout.  Elevated PSA  -Reviewed urology notes.  PSA has increased since their last visit and has not had recent follow-up.  Stressed importance of follow-up with urology and phone number provided. Can refer if needed, but prior eval for elevated PSA.   Skin tags, multiple acquired - Plan: Dermatology pathology  -3 predominant lesions removed as above. No complications.  Sent largest for path review. rtc if smaller lesions need to be removed.   No orders of the defined types were placed in this encounter.  Patient Instructions   Call Dr. Louis Meckel for appointment to discuss elevated PSA. Let me know if you have difficulty scheduling that appointment or need a referral.  [phone]  Muscle aches in joints can be related to arthritis, sore muscles from lifting, from prior injuries, and less likely gout. I can check some muscle  tests and vitamin D level to see if that may also be an issue. Tylenol if needed for pain., make sure to stay hydrated throughout the day and let me know if you have any gout flares. If one dominant joint or area is bothering you more - return to check that are further.   3 skin tags were removed on the left neck today.  Please return to have other skin tags removed at next visit.  Thank you for coming in today, let me know if there are questions.   Skin Tag, Adult A skin tag (acrochordon) is a soft, extra growth of skin. Most skin tags are flesh-colored and rarely bigger than a pencil eraser. They commonly form near areas where there are folds in the skin, such as the armpit or groin. Skin tags are not dangerous, and they do not spread from person to person (are not contagious). You may have one skin tag or several. Skin tags do not require treatment. However, your health care provider may recommend removal of a skin tag if it:  Gets irritated from clothing.  Bleeds.  Is visible and unsightly.  Your health care provider can remove skin tags with a simple surgical procedure or a procedure that involves freezing the skin tag. Follow these instructions at home:  Watch for any changes in your skin tag. A normal skin tag does not require any other special care at home.  Take over-the-counter and prescription medicines only as told by your health care provider.  Keep all follow-up visits as told by your health care provider. This is important. Contact a health care provider if:  You have a skin tag that: ? Becomes painful. ? Changes color. ? Bleeds. ? Swells.  You develop more skin tags. This information is not intended to replace advice given to you by your health care provider. Make sure you discuss any questions you have with your health care provider. Document Released: 05/29/2015 Document Revised: 01/08/2016 Document Reviewed: 05/29/2015 Elsevier Interactive Patient Education  [employer].   If you have lab work done today you will be contacted with your lab results within the next 2 weeks.  If you have not heard from [place] then please contact us. The fastest way to get your  results is to register for My Chart.   IF you received an x-ray today, you will receive an invoice from [facility]. Please contact [facility] at [phone] with questions or concerns regarding your invoice.   IF you received labwork today, you will receive an invoice from [facility]. Please contact [facility] at [phone] with questions or concerns regarding your invoice.   Our billing staff will not be able to assist you with questions regarding bills from these companies.  You will be contacted with the lab results as soon as they are available. The fastest way to get your results is to activate your My Chart account. Instructions are located on the last page of this paperwork. If you have not heard from [place] regarding the results in 2 weeks, please contact this office.       I personally performed the services described in this documentation, which was scribed in my presence. The recorded information has been reviewed and considered for accuracy and completeness, addended by me as needed, and agree with information above.  Signed,   Merri Ray, MD [facility].  01/14/18 12:24 PM

## 2018-01-14 NOTE — Patient Instructions (Addendum)
Call Dr. Louis Meckel for appointment to discuss elevated PSA. Let me know if you have difficulty scheduling that appointment or need a referral.  [phone]  Muscle aches in joints can be related to arthritis, sore muscles from lifting, from prior injuries, and less likely gout. I can check some muscle tests and vitamin D level to see if that may also be an issue. Tylenol if needed for pain., make sure to stay hydrated throughout the day and let me know if you have any gout flares. If one dominant joint or area is bothering you more - return to check that are further.   3 skin tags were removed on the left neck today.  Please return to have other skin tags removed at next visit.  Thank you for coming in today, let me know if there are questions.   Skin Tag, Adult A skin tag (acrochordon) is a soft, extra growth of skin. Most skin tags are flesh-colored and rarely bigger than a pencil eraser. They commonly form near areas where there are folds in the skin, such as the armpit or groin. Skin tags are not dangerous, and they do not spread from person to person (are not contagious). You may have one skin tag or several. Skin tags do not require treatment. However, your health care provider may recommend removal of a skin tag if it:  Gets irritated from clothing.  Bleeds.  Is visible and unsightly.  Your health care provider can remove skin tags with a simple surgical procedure or a procedure that involves freezing the skin tag. Follow these instructions at home:  Watch for any changes in your skin tag. A normal skin tag does not require any other special care at home.  Take over-the-counter and prescription medicines only as told by your health care provider.  Keep all follow-up visits as told by your health care provider. This is important. Contact a health care provider if:  You have a skin tag that: ? Becomes painful. ? Changes color. ? Bleeds. ? Swells.  You develop more skin  tags. This information is not intended to replace advice given to you by your health care provider. Make sure you discuss any questions you have with your health care provider. Document Released: 05/29/2015 Document Revised: 01/08/2016 Document Reviewed: 05/29/2015 Elsevier Interactive Patient Education  [employer].   If you have lab work done today you will be contacted with your lab results within the next 2 weeks.  If you have not heard from [place] then please contact us. The fastest way to get your results is to register for My Chart.   IF you received an x-ray today, you will receive an invoice from [facility]. Please contact [facility] at [phone] with questions or concerns regarding your invoice.   IF you received labwork today, you will receive an invoice from [facility]. Please contact [facility] at [phone] with questions or concerns regarding your invoice.   Our billing staff will not be able to assist you with questions regarding bills from these companies.  You will be contacted with the lab results as soon as they are available. The fastest way to get your results is to activate your My Chart account. Instructions are located on the last page of this paperwork. If you have not heard from [place] regarding the results in 2 weeks, please contact this office.

## 2018-01-15 LAB — VITAMIN D 25 HYDROXY (VIT D DEFICIENCY, FRACTURES): VIT D 25 HYDROXY: 7.8 ng/mL — AB (ref 30.0–100.0)

## 2018-01-15 LAB — CK: CK TOTAL: 142 U/L (ref 24–204)

## 2018-01-27 ENCOUNTER — Other Ambulatory Visit: Payer: Self-pay | Admitting: Family Medicine

## 2018-01-27 DIAGNOSIS — R7989 Other specified abnormal findings of blood chemistry: Secondary | ICD-10-CM

## 2018-01-27 MED ORDER — VITAMIN D (ERGOCALCIFEROL) 1.25 MG (50000 UNIT) PO CAPS: 50000.0000 [IU] | ORAL_CAPSULE | ORAL | 0 refills | Status: DC

## 2018-01-27 NOTE — Progress Notes (Signed)
See lab notes

## 2018-02-25 MED FILL — VIT D2 1.25 MG (50,000 UNIT: 1.25 MG | 84 days supply | Qty: 12 | Fill #0

## 2018-02-25 MED FILL — TADALAFIL 20 MG TABS: 20 | 30 days supply | Qty: 5 | Fill #1

## 2018-02-25 MED FILL — SHINGRIX 50 MCG SUS: 50 | 1 days supply | Qty: 1 | Fill #1

## 2018-03-11 DIAGNOSIS — R972 Elevated prostate specific antigen [PSA]: Secondary | ICD-10-CM | POA: Diagnosis not present

## 2018-03-11 DIAGNOSIS — E291 Testicular hypofunction: Secondary | ICD-10-CM | POA: Diagnosis not present

## 2018-03-20 NOTE — Telephone Encounter (Signed)
done

## 2018-04-14 MED FILL — levoFLOXacin 750 MG TABS: 750 | 1 days supply | Qty: 1 | Fill #0

## 2018-04-15 DIAGNOSIS — D075 Carcinoma in situ of prostate: Secondary | ICD-10-CM | POA: Diagnosis not present

## 2018-04-15 DIAGNOSIS — C61 Malignant neoplasm of prostate: Secondary | ICD-10-CM | POA: Diagnosis not present

## 2018-04-15 DIAGNOSIS — R972 Elevated prostate specific antigen [PSA]: Secondary | ICD-10-CM | POA: Diagnosis not present

## 2018-04-21 DIAGNOSIS — C61 Malignant neoplasm of prostate: Secondary | ICD-10-CM | POA: Diagnosis not present

## 2018-04-29 DIAGNOSIS — C61 Malignant neoplasm of prostate: Secondary | ICD-10-CM | POA: Diagnosis not present

## 2018-05-19 ENCOUNTER — Other Ambulatory Visit: Payer: Self-pay | Admitting: Urology

## 2018-06-18 DIAGNOSIS — C61 Malignant neoplasm of prostate: Secondary | ICD-10-CM | POA: Diagnosis not present

## 2018-06-18 DIAGNOSIS — M6281 Muscle weakness (generalized): Secondary | ICD-10-CM | POA: Diagnosis not present

## 2018-06-19 NOTE — Patient Instructions (Signed)
Adam Estrada.  06/19/2018   Your procedure is scheduled on: 06-30-2018    Report to [facility] Main  Entrance     Report to admitting at 9:15AM    Call this number if you have problems the morning of surgery [phone]      Remember: [place]. Do not eat food or drink liquids :After Midnight. BRUSH YOUR TEETH MORNING OF SURGERY AND RINSE YOUR MOUTH OUT, NO CHEWING GUM CANDY OR MINTS.     Take these medicines the morning of surgery with A SIP OF WATER: ALLOPURINOL, COLCHICINE IF NEEDED                                You may not have any metal on your body including hair pins and              piercings  Do not wear jewelry, make-up, lotions, powders or perfumes, deodorant                         Men may shave face and neck.   Do not bring valuables to the hospital. [place].  Contacts, dentures or bridgework may not be worn into surgery.  Leave suitcase in the car. After surgery it may be brought to your room.                 Please read over the following fact sheets you were given: _____________________________________________________________________             [facility] - Preparing for Surgery Before surgery, you can play an important role.  Because skin is not sterile, your skin needs to be as free of germs as possible.  You can reduce the number of germs on your skin by washing with CHG (chlorahexidine gluconate) soap before surgery.  CHG is an antiseptic cleaner which kills germs and bonds with the skin to continue killing germs even after washing. Please DO NOT use if you have an allergy to CHG or antibacterial soaps.  If your skin becomes reddened/irritated stop using the CHG and inform your nurse when you arrive at Short Stay. Do not shave (including legs and underarms) for at least 48 hours prior to the first CHG shower.  You may shave  your face/neck. Please follow these instructions carefully:  1.  Shower with CHG Soap the night before surgery and the  morning of Surgery.  2.  If you choose to wash your hair, wash your hair first as usual with your  normal  shampoo.  3.  After you shampoo, rinse your hair and body thoroughly to remove the  shampoo.                           4.  Use CHG as you would any other liquid soap.  You can apply chg directly  to the skin and wash                       Gently with a scrungie or clean washcloth.  5.  Apply the CHG Soap to your body ONLY FROM THE  NECK DOWN.   Do not use on face/ open                           Wound or open sores. Avoid contact with eyes, ears mouth and genitals (private parts).                       Wash face,  Genitals (private parts) with your normal soap.             6.  Wash thoroughly, paying special attention to the area where your surgery  will be performed.  7.  Thoroughly rinse your body with warm water from the neck down.  8.  DO NOT shower/wash with your normal soap after using and rinsing off  the CHG Soap.                9.  Pat yourself dry with a clean towel.            10.  Wear clean pajamas.            11.  Place clean sheets on your bed the night of your first shower and do not  sleep with pets. Day of Surgery : Do not apply any lotions/deodorants the morning of surgery.  Please wear clean clothes to the hospital/surgery center.  FAILURE TO FOLLOW THESE INSTRUCTIONS MAY RESULT IN THE CANCELLATION OF YOUR SURGERY PATIENT SIGNATURE_________________________________  NURSE SIGNATURE__________________________________  ________________________________________________________________________   Adam Phenix  An incentive spirometer is a tool that can help keep your lungs clear and active. This tool measures how well you are filling your lungs with each breath. Taking long deep breaths may help reverse or decrease the chance of developing  breathing (pulmonary) problems (especially infection) following:  A long period of time when you are unable to move or be active. BEFORE THE PROCEDURE   If the spirometer includes an indicator to show your best effort, your nurse or respiratory therapist will set it to a desired goal.  If possible, sit up straight or lean slightly forward. Try not to slouch.  Hold the incentive spirometer in an upright position. INSTRUCTIONS FOR USE  1. Sit on the edge of your bed if possible, or sit up as far as you can in bed or on a chair. 2. Hold the incentive spirometer in an upright position. 3. Breathe out normally. 4. Place the mouthpiece in your mouth and seal your lips tightly around it. 5. Breathe in slowly and as deeply as possible, raising the piston or the ball toward the top of the column. 6. Hold your breath for 3-5 seconds or for as long as possible. Allow the piston or ball to fall to the bottom of the column. 7. Remove the mouthpiece from your mouth and breathe out normally. 8. Rest for a few seconds and repeat Steps 1 through 7 at least 10 times every 1-2 hours when you are awake. Take your time and take a few normal breaths between deep breaths. 9. The spirometer may include an indicator to show your best effort. Use the indicator as a goal to work toward during each repetition. 10. After each set of 10 deep breaths, practice coughing to be sure your lungs are clear. If you have an incision (the cut made at the time of surgery), support your incision when coughing by placing a pillow or rolled up towels firmly against it. Once you are able  to get out of bed, walk around indoors and cough well. You may stop using the incentive spirometer when instructed by your caregiver.  RISKS AND COMPLICATIONS  Take your time so you do not get dizzy or light-headed.  If you are in pain, you may need to take or ask for pain medication before doing incentive spirometry. It is harder to take a deep  breath if you are having pain. AFTER USE  Rest and breathe slowly and easily.  It can be helpful to keep track of a log of your progress. Your caregiver can provide you with a simple table to help with this. If you are using the spirometer at home, follow these instructions: [place] IF:   You are having difficultly using the spirometer.  You have trouble using the spirometer as often as instructed.  Your pain medication is not giving enough relief while using the spirometer.  You develop fever of 100.5 F (38.1 C) or higher. SEEK IMMEDIATE MEDICAL CARE IF:   You cough up bloody sputum that had not been present before.  You develop fever of 102 F (38.9 C) or greater.  You develop worsening pain at or near the incision site. MAKE SURE YOU:   Understand these instructions.  Will watch your condition.  Will get help right away if you are not doing well or get worse. Document Released: 09/24/2006 Document Revised: 08/06/2011 Document Reviewed: 11/25/2006 ExitCare Patient Information 2014 ExitCare, [place].   ________________________________________________________________________  WHAT IS A BLOOD TRANSFUSION? Blood Transfusion Information  A transfusion is the replacement of blood or some of its parts. Blood is made up of multiple cells which provide different functions.  Red blood cells carry oxygen and are used for blood loss replacement.  White blood cells fight against infection.  Platelets control bleeding.  Plasma helps clot blood.  Other blood products are available for specialized needs, such as hemophilia or other clotting disorders. BEFORE THE TRANSFUSION  Who gives blood for transfusions?   Healthy volunteers who are fully evaluated to make sure their blood is safe. This is blood bank blood. Transfusion therapy is the safest it has ever been in the practice of medicine. Before blood is taken from a donor, a complete history is taken to make sure  that person has no history of diseases nor engages in risky social behavior (examples are intravenous drug use or sexual activity with multiple partners). The donor's travel history is screened to minimize risk of transmitting infections, such as malaria. The donated blood is tested for signs of infectious diseases, such as HIV and hepatitis. The blood is then tested to be sure it is compatible with you in order to minimize the chance of a transfusion reaction. If you or a relative donates blood, this is often done in anticipation of surgery and is not appropriate for emergency situations. It takes many days to process the donated blood. RISKS AND COMPLICATIONS Although transfusion therapy is very safe and saves many lives, the main dangers of transfusion include:   Getting an infectious disease.  Developing a transfusion reaction. This is an allergic reaction to something in the blood you were given. Every precaution is taken to prevent this. The decision to have a blood transfusion has been considered carefully by your caregiver before blood is given. Blood is not given unless the benefits outweigh the risks. AFTER THE TRANSFUSION  Right after receiving a blood transfusion, you will usually feel much better and more energetic. This is especially true  if your red blood cells have gotten low (anemic). The transfusion raises the level of the red blood cells which carry oxygen, and this usually causes an energy increase.  The nurse administering the transfusion will monitor you carefully for complications. HOME CARE INSTRUCTIONS  No special instructions are needed after a transfusion. You may find your energy is better. Speak with your caregiver about any limitations on activity for underlying diseases you may have. SEEK MEDICAL CARE IF:   Your condition is not improving after your transfusion.  You develop redness or irritation at the intravenous (IV) site. SEEK IMMEDIATE MEDICAL CARE IF:  Any of  the following symptoms occur over the next 12 hours:  Shaking chills.  You have a temperature by mouth above 102 F (38.9 C), not controlled by medicine.  Chest, back, or muscle pain.  People around you feel you are not acting correctly or are confused.  Shortness of breath or difficulty breathing.  Dizziness and fainting.  You get a rash or develop hives.  You have a decrease in urine output.  Your urine turns a dark color or changes to pink, red, or brown. Any of the following symptoms occur over the next 10 days:  You have a temperature by mouth above 102 F (38.9 C), not controlled by medicine.  Shortness of breath.  Weakness after normal activity.  The white part of the eye turns yellow (jaundice).  You have a decrease in the amount of urine or are urinating less often.  Your urine turns a dark color or changes to pink, red, or brown. Document Released: 05/11/2000 Document Revised: 08/06/2011 Document Reviewed: 12/29/2007 [facility] Patient Information 2014 [place], [place].  _______________________________________________________________________

## 2018-06-23 ENCOUNTER — Encounter (HOSPITAL_COMMUNITY): Payer: Self-pay

## 2018-06-23 ENCOUNTER — Encounter (HOSPITAL_COMMUNITY)
Admission: RE | Admit: 2018-06-23 | Discharge: 2018-06-23 | Disposition: A | Discharge status: Home / Self Care | Payer: 59 | Source: Ambulatory Visit | Attending: Urology | Admitting: Urology

## 2018-06-23 ENCOUNTER — Other Ambulatory Visit: Payer: Self-pay

## 2018-06-23 DIAGNOSIS — I10 Essential (primary) hypertension: Secondary | ICD-10-CM | POA: Insufficient documentation

## 2018-06-23 DIAGNOSIS — C61 Malignant neoplasm of prostate: Secondary | ICD-10-CM | POA: Insufficient documentation

## 2018-06-23 DIAGNOSIS — Z01818 Encounter for other preprocedural examination: Secondary | ICD-10-CM | POA: Diagnosis not present

## 2018-06-23 HISTORY — DX: Malignant neoplasm of prostate: C61

## 2018-06-23 LAB — CBC
HCT: 45 % (ref 39.0–52.0)
Hemoglobin: 14.7 g/dL (ref 13.0–17.0)
MCH: 27.7 pg (ref 26.0–34.0)
MCHC: 32.7 g/dL (ref 30.0–36.0)
MCV: 84.7 fL (ref 80.0–100.0)
Platelets: 305 10*3/uL (ref 150–400)
RBC: 5.31 MIL/uL (ref 4.22–5.81)
RDW: 13.6 % (ref 11.5–15.5)
WBC: 6.6 10*3/uL (ref 4.0–10.5)
nRBC: 0 % (ref 0.0–0.2)

## 2018-06-23 LAB — BASIC METABOLIC PANEL
Anion gap: 7 (ref 5–15)
BUN: 12 mg/dL (ref 6–20)
CO2: 24 mmol/L (ref 22–32)
Calcium: 9.1 mg/dL (ref 8.9–10.3)
Chloride: 108 mmol/L (ref 98–111)
Creatinine, Ser: 1.21 mg/dL (ref 0.61–1.24)
GFR calc Af Amer: >60 mL/min (ref >60)
GFR calc non Af Amer: >60 mL/min (ref >60)
Glucose, Bld: 106 mg/dL — ABNORMAL HIGH (ref 70–99)
Potassium: 4 mmol/L (ref 3.5–5.1)
Sodium: 139 mmol/L (ref 135–145)

## 2018-06-23 LAB — NO BLOOD PRODUCTS

## 2018-06-25 DIAGNOSIS — N393 Stress incontinence (female) (male): Secondary | ICD-10-CM | POA: Diagnosis not present

## 2018-06-25 DIAGNOSIS — M62838 Other muscle spasm: Secondary | ICD-10-CM | POA: Diagnosis not present

## 2018-06-25 DIAGNOSIS — M6281 Muscle weakness (generalized): Secondary | ICD-10-CM | POA: Diagnosis not present

## 2018-06-27 NOTE — H&P (Signed)
CC/HPI: CC: Prostate Cancer   Physician requesting consult: Dr. Burman Nieves  PCP: Dr. Merri Ray   Adam Estrada is a 58 year old gentleman who was found to have an elevated PSA of 4.14 prompting a TRUS biopsy of the prostate on 04/15/18. This indicated Gleason 3+4=7 adenocarcinoma of the prostate with 2 out of 12 biopsy cores positive for malignancy.   Family history: Uncle and grandfather.   Imaging studies: None.   PMH: He has a history of DVT (postoperative after repair of Achilles tendon) and gout. 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  PSH: No abdominal surgeries.   TNM stage: cT1c Nx Mx  PSA: 4.14  Gleason score: 3+4=7  Biopsy (04/15/18): 2/12 cores positive  Left: L lateral mid (50%, 3+3=6), L mid (30%, 3+4=7)  Right: Benign  Prostate volume: 56.1 cc   Nomogram  OC disease: 56%  EPE: 43%  SVI: 2%  LNI: 2%  PFS (5 year, 10 year): 91%, 84%   Urinary function: IPSS is 3.  Erectile function: SHIM score is 10.     ALLERGIES: No Allergies    MEDICATIONS: Allopurinol 100 mg tablet  Aspirin 325 mg tablet Oral  Sildenafil Citrate 20 MG Oral Tablet 0 Oral  Vitamin D     GU PSH: Prostate Needle Biopsy - 04/15/2018 Vasectomy - 2016      PSH Notes: Primary Repair Of Ruptured Achilles Tendon, Surgery Of Male Genitalia Vasectomy   NON-GU PSH: Repair Achilles Tendon - 2016 Surgical Pathology, Gross And Microscopic Examination For Prostate Needle - 04/15/2018    GU PMH: Prostate Cancer - 04/21/2018 Primary hypogonadism, Low testosterone - 2016    NON-GU PMH: DVT, History, History of deep venous thrombosis - 2016 Gout    FAMILY HISTORY: Death of family member - Runs In Family   SOCIAL HISTORY: Marital Status: Married Preferred Language: English; Ethnicity: Not Hispanic Or Latino; Race: Black or African American     Notes: Occupation, Never a smoker, Married, Caffeine use, Alcohol use   REVIEW OF SYSTEMS:    GU Review Male:    Patient denies frequent urination, hard to postpone urination, burning/ pain with urination, get up at night to urinate, leakage of urine, stream starts and stops, trouble starting your streams, and have to strain to urinate .  Gastrointestinal (Lower):   Patient denies diarrhea and constipation.  Gastrointestinal (Upper):   Patient denies nausea and vomiting.  Constitutional:   Patient denies fatigue, weight loss, fever, and night sweats.  Skin:   Patient denies skin rash/ lesion and itching.  Eyes:   Patient denies blurred vision and double vision.  Ears/ Nose/ Throat:   Patient denies sore throat and sinus problems.  Hematologic/Lymphatic:   Patient denies swollen glands and easy bruising.  Cardiovascular:   Patient denies leg swelling and chest pains.  Respiratory:   Patient denies cough and shortness of breath.  Endocrine:   Patient denies excessive thirst.  Musculoskeletal:   Patient denies back pain and joint pain.  Neurological:   Patient denies headaches and dizziness.  Psychologic:   Patient denies depression and anxiety.   VITAL SIGNS:     Weight 280 lb / 127.01 kg  Height 70 in / 177.8 cm  BMI 40.2 kg/m    MULTI-SYSTEM PHYSICAL EXAMINATION:    Constitutional: Well-nourished. No physical deformities. Normally developed. Good grooming.  Neck: Neck symmetrical, not swollen. Normal tracheal position.  Respiratory: No labored breathing, no use of accessory  muscles. Clear bilaterally.  Cardiovascular: Normal temperature, normal extremity pulses, no swelling, no varicosities. Regular rate and rhythm.  Lymphatic: No enlargement of neck, axillae, groin.  Skin: No paleness, no jaundice, no cyanosis. No lesion, no ulcer, no rash.  Neurologic / Psychiatric: Oriented to time, oriented to place, oriented to person. No depression, no anxiety, no agitation.  Gastrointestinal: No mass, no tenderness, no rigidity, mildly obese abdomen.   Eyes: Normal conjunctivae. Normal eyelids.  Ears,  Nose, Mouth, and Throat: Left ear no scars, no lesions, no masses. Right ear no scars, no lesions, no masses. Nose no scars, no lesions, no masses. Normal hearing. Normal lips.  Musculoskeletal: Normal gait and station of head and neck.      ASSESSMENT:      ICD-10 Details  1 GU:   Prostate Cancer - C61    PLAN:         1. Favorable intermediate risk prostate cancer:  He will be scheduled for a bilateral nerve-sparing robot assisted laparoscopic radical prostatectomy and bilateral pelvic lymphadenectomy. He has appropriate expectations with regard to postoperative erectile function considering his preoperative function. 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 We have discussed the potential risk of bleeding and how we would manage this that likely would require reoperation with the inability to consider blood transfusions. He does accept this increased risk and wishes to proceed as outlined above.

## 2018-06-29 MED ORDER — DEXTROSE 5 % IV SOLN
3.0000 g | Freq: Once | INTRAVENOUS | Status: AC
  Administered 2018-06-30: 3 g via INTRAVENOUS
  Filled 2018-06-29: qty 3

## 2018-06-30 ENCOUNTER — Ambulatory Visit (HOSPITAL_COMMUNITY): Payer: 59 | Admitting: Certified Registered Nurse Anesthetist

## 2018-06-30 ENCOUNTER — Encounter (HOSPITAL_COMMUNITY)
Admission: RE | Disposition: A | Discharge status: Home / Self Care | Payer: Self-pay | Source: Other Acute Inpatient Hospital | Attending: Urology

## 2018-06-30 ENCOUNTER — Ambulatory Visit (HOSPITAL_COMMUNITY): Payer: 59 | Admitting: Physician Assistant

## 2018-06-30 ENCOUNTER — Observation Stay (HOSPITAL_COMMUNITY)
Admission: RE | Admit: 2018-06-30 | Discharge: 2018-07-01 | Disposition: A | Discharge status: Home / Self Care | Payer: 59 | Source: Other Acute Inpatient Hospital | Attending: Urology | Admitting: Urology

## 2018-06-30 ENCOUNTER — Other Ambulatory Visit: Payer: Self-pay

## 2018-06-30 ENCOUNTER — Encounter (HOSPITAL_COMMUNITY): Payer: Self-pay

## 2018-06-30 DIAGNOSIS — C61 Malignant neoplasm of prostate: Principal | ICD-10-CM | POA: Insufficient documentation

## 2018-06-30 DIAGNOSIS — Z7982 Long term (current) use of aspirin: Secondary | ICD-10-CM | POA: Insufficient documentation

## 2018-06-30 DIAGNOSIS — Z87898 Personal history of other specified conditions: Secondary | ICD-10-CM | POA: Insufficient documentation

## 2018-06-30 DIAGNOSIS — Z79899 Other long term (current) drug therapy: Secondary | ICD-10-CM | POA: Diagnosis not present

## 2018-06-30 DIAGNOSIS — M109 Gout, unspecified: Secondary | ICD-10-CM | POA: Insufficient documentation

## 2018-06-30 DIAGNOSIS — I10 Essential (primary) hypertension: Secondary | ICD-10-CM | POA: Diagnosis not present

## 2018-06-30 DIAGNOSIS — Z6841 Body Mass Index (BMI) 40.0 and over, adult: Secondary | ICD-10-CM | POA: Insufficient documentation

## 2018-06-30 DIAGNOSIS — Z8546 Personal history of malignant neoplasm of prostate: Secondary | ICD-10-CM | POA: Diagnosis present

## 2018-06-30 DIAGNOSIS — Z86718 Personal history of other venous thrombosis and embolism: Secondary | ICD-10-CM | POA: Diagnosis not present

## 2018-06-30 HISTORY — PX: ROBOT ASSISTED LAPAROSCOPIC RADICAL PROSTATECTOMY: SHX5141

## 2018-06-30 HISTORY — PX: LYMPHADENECTOMY: SHX5960

## 2018-06-30 LAB — HEMOGLOBIN AND HEMATOCRIT, BLOOD
HCT: 42.4 % (ref 39.0–52.0)
Hemoglobin: 13.6 g/dL (ref 13.0–17.0)

## 2018-06-30 SURGERY — XI ROBOTIC ASSISTED LAPAROSCOPIC RADICAL PROSTATECTOMY LEVEL 2
Anesthesia: General

## 2018-06-30 MED ORDER — ROCURONIUM BROMIDE 100 MG/10ML IV SOLN
INTRAVENOUS | Status: AC
  Filled 2018-06-30: qty 1

## 2018-06-30 MED ORDER — SULFAMETHOXAZOLE-TRIMETHOPRIM 800-160 MG PO TABS: 1.0000 | ORAL_TABLET | Freq: Two times a day (BID) | ORAL | 0 refills | Status: DC

## 2018-06-30 MED ORDER — MORPHINE SULFATE (PF) 2 MG/ML IV SOLN
2.0000 mg | INTRAVENOUS | Status: DC | PRN
  Administered 2018-06-30: 2 mg via INTRAVENOUS
  Filled 2018-06-30: qty 1

## 2018-06-30 MED ORDER — SCOPOLAMINE 1 MG/3DAYS TD PT72
1.0000 | MEDICATED_PATCH | TRANSDERMAL | Status: DC
  Administered 2018-06-30: 1.5 mg via TRANSDERMAL

## 2018-06-30 MED ORDER — DEXAMETHASONE SODIUM PHOSPHATE 10 MG/ML IJ SOLN
INTRAMUSCULAR | Status: AC
  Filled 2018-06-30: qty 1

## 2018-06-30 MED ORDER — MAGNESIUM CITRATE PO SOLN: 1.0000 | Freq: Once | ORAL | Status: DC

## 2018-06-30 MED ORDER — SUGAMMADEX SODIUM 500 MG/5ML IV SOLN
INTRAVENOUS | Status: DC | PRN
  Administered 2018-06-30: 300 mg via INTRAVENOUS

## 2018-06-30 MED ORDER — BUPIVACAINE HCL (PF) 0.25 % IJ SOLN
INTRAMUSCULAR | Status: AC
  Filled 2018-06-30: qty 30

## 2018-06-30 MED ORDER — ONDANSETRON HCL 4 MG/2ML IJ SOLN
INTRAMUSCULAR | Status: DC | PRN
  Administered 2018-06-30: 4 mg via INTRAVENOUS

## 2018-06-30 MED ORDER — SODIUM CHLORIDE 0.9 % IR SOLN
Status: DC | PRN
  Administered 2018-06-30: 1000 mL

## 2018-06-30 MED ORDER — MIDAZOLAM HCL 5 MG/5ML IJ SOLN
INTRAMUSCULAR | Status: DC | PRN
  Administered 2018-06-30: 2 mg via INTRAVENOUS

## 2018-06-30 MED ORDER — HEPARIN SODIUM (PORCINE) 1000 UNIT/ML IJ SOLN
INTRAMUSCULAR | Status: AC
  Filled 2018-06-30: qty 1

## 2018-06-30 MED ORDER — FENTANYL CITRATE (PF) 250 MCG/5ML IJ SOLN
INTRAMUSCULAR | Status: AC
  Filled 2018-06-30: qty 5

## 2018-06-30 MED ORDER — PROPOFOL 10 MG/ML IV BOLUS
INTRAVENOUS | Status: DC | PRN
  Administered 2018-06-30: 200 mg via INTRAVENOUS

## 2018-06-30 MED ORDER — SODIUM CHLORIDE 0.9 % IV BOLUS
1000.0000 mL | Freq: Once | INTRAVENOUS | Status: AC
  Administered 2018-06-30: 1000 mL via INTRAVENOUS

## 2018-06-30 MED ORDER — TRAMADOL HCL 50 MG PO TABS: 50.0000 mg | ORAL_TABLET | Freq: Four times a day (QID) | ORAL | 0 refills | Status: DC | PRN

## 2018-06-30 MED ORDER — FENTANYL CITRATE (PF) 100 MCG/2ML IJ SOLN
INTRAMUSCULAR | Status: DC | PRN
  Administered 2018-06-30 (×7): 50 ug via INTRAVENOUS

## 2018-06-30 MED ORDER — ZOLPIDEM TARTRATE 5 MG PO TABS: 5.0000 mg | ORAL_TABLET | Freq: Every evening | ORAL | Status: DC | PRN

## 2018-06-30 MED ORDER — ONDANSETRON HCL 4 MG/2ML IJ SOLN
INTRAMUSCULAR | Status: AC
  Filled 2018-06-30: qty 2

## 2018-06-30 MED ORDER — KCL IN DEXTROSE-NACL 20-5-0.45 MEQ/L-%-% IV SOLN
INTRAVENOUS | Status: DC
  Administered 2018-06-30 – 2018-07-01 (×2): via INTRAVENOUS
  Filled 2018-06-30 (×4): qty 1000

## 2018-06-30 MED ORDER — BELLADONNA ALKALOIDS-OPIUM 16.2-60 MG RE SUPP: 1.0000 | Freq: Four times a day (QID) | RECTAL | Status: DC | PRN

## 2018-06-30 MED ORDER — MIDAZOLAM HCL 2 MG/2ML IJ SOLN
INTRAMUSCULAR | Status: AC
  Filled 2018-06-30: qty 2

## 2018-06-30 MED ORDER — ACETAMINOPHEN 325 MG PO TABS: 650.0000 mg | ORAL_TABLET | ORAL | Status: DC | PRN

## 2018-06-30 MED ORDER — FLEET ENEMA 7-19 GM/118ML RE ENEM: 1.0000 | ENEMA | Freq: Once | RECTAL | Status: DC

## 2018-06-30 MED ORDER — LACTATED RINGERS IV SOLN
INTRAVENOUS | Status: DC
  Administered 2018-06-30 (×2): via INTRAVENOUS

## 2018-06-30 MED ORDER — SCOPOLAMINE 1 MG/3DAYS TD PT72
MEDICATED_PATCH | TRANSDERMAL | Status: AC
  Filled 2018-06-30: qty 1

## 2018-06-30 MED ORDER — DIPHENHYDRAMINE HCL 50 MG/ML IJ SOLN: 12.5000 mg | Freq: Four times a day (QID) | INTRAMUSCULAR | Status: DC | PRN

## 2018-06-30 MED ORDER — KETOROLAC TROMETHAMINE 15 MG/ML IJ SOLN
15.0000 mg | Freq: Four times a day (QID) | INTRAMUSCULAR | Status: DC
  Administered 2018-06-30 – 2018-07-01 (×3): 15 mg via INTRAVENOUS
  Filled 2018-06-30 (×3): qty 1

## 2018-06-30 MED ORDER — KETAMINE HCL 10 MG/ML IJ SOLN
INTRAMUSCULAR | Status: DC | PRN
  Administered 2018-06-30: 30 mg via INTRAVENOUS

## 2018-06-30 MED ORDER — ONDANSETRON HCL 4 MG/2ML IJ SOLN: 4.0000 mg | INTRAMUSCULAR | Status: DC | PRN

## 2018-06-30 MED ORDER — LIDOCAINE 2% (20 MG/ML) 5 ML SYRINGE
INTRAMUSCULAR | Status: DC | PRN
  Administered 2018-06-30: 100 mg via INTRAVENOUS

## 2018-06-30 MED ORDER — CEFAZOLIN SODIUM-DEXTROSE 1-4 GM/50ML-% IV SOLN
1.0000 g | Freq: Three times a day (TID) | INTRAVENOUS | Status: AC
  Administered 2018-06-30 – 2018-07-01 (×2): 1 g via INTRAVENOUS
  Filled 2018-06-30 (×3): qty 50

## 2018-06-30 MED ORDER — HYDROMORPHONE HCL 1 MG/ML IJ SOLN
INTRAMUSCULAR | Status: AC
  Administered 2018-06-30: 0.5 mg via INTRAVENOUS
  Filled 2018-06-30: qty 2

## 2018-06-30 MED ORDER — LIDOCAINE 2% (20 MG/ML) 5 ML SYRINGE
INTRAMUSCULAR | Status: AC
  Filled 2018-06-30: qty 5

## 2018-06-30 MED ORDER — BUPIVACAINE-EPINEPHRINE 0.25% -1:200000 IJ SOLN
INTRAMUSCULAR | Status: DC | PRN
  Administered 2018-06-30: 30 mL

## 2018-06-30 MED ORDER — GLYCOPYRROLATE PF 0.2 MG/ML IJ SOSY
PREFILLED_SYRINGE | INTRAMUSCULAR | Status: AC
  Filled 2018-06-30: qty 1

## 2018-06-30 MED ORDER — BACITRACIN-NEOMYCIN-POLYMYXIN 400-5-5000 EX OINT: 1.0000 "application " | TOPICAL_OINTMENT | Freq: Three times a day (TID) | CUTANEOUS | Status: DC | PRN

## 2018-06-30 MED ORDER — LACTATED RINGERS IV SOLN
INTRAVENOUS | Status: DC | PRN
  Administered 2018-06-30: 12:00:00

## 2018-06-30 MED ORDER — DIPHENHYDRAMINE HCL 12.5 MG/5ML PO ELIX: 12.5000 mg | ORAL_SOLUTION | Freq: Four times a day (QID) | ORAL | Status: DC | PRN

## 2018-06-30 MED ORDER — HYDROMORPHONE HCL 1 MG/ML IJ SOLN
0.2500 mg | INTRAMUSCULAR | Status: DC | PRN
  Administered 2018-06-30 (×2): 0.5 mg via INTRAVENOUS

## 2018-06-30 MED ORDER — PROPOFOL 10 MG/ML IV BOLUS
INTRAVENOUS | Status: AC
  Filled 2018-06-30: qty 20

## 2018-06-30 MED ORDER — PROMETHAZINE HCL 25 MG/ML IJ SOLN: 6.2500 mg | INTRAMUSCULAR | Status: DC | PRN

## 2018-06-30 MED ORDER — SUGAMMADEX SODIUM 500 MG/5ML IV SOLN
INTRAVENOUS | Status: AC
  Filled 2018-06-30: qty 5

## 2018-06-30 MED ORDER — FENTANYL CITRATE (PF) 100 MCG/2ML IJ SOLN
INTRAMUSCULAR | Status: AC
  Filled 2018-06-30: qty 2

## 2018-06-30 MED ORDER — DEXAMETHASONE SODIUM PHOSPHATE 4 MG/ML IJ SOLN
INTRAMUSCULAR | Status: DC | PRN
  Administered 2018-06-30: 10 mg via INTRAVENOUS

## 2018-06-30 MED ORDER — DOCUSATE SODIUM 100 MG PO CAPS
100.0000 mg | ORAL_CAPSULE | Freq: Two times a day (BID) | ORAL | Status: DC
  Administered 2018-06-30: 100 mg via ORAL
  Filled 2018-06-30: qty 1

## 2018-06-30 MED ORDER — MEPERIDINE HCL 50 MG/ML IJ SOLN: 6.2500 mg | INTRAMUSCULAR | Status: DC | PRN

## 2018-06-30 MED ORDER — ALLOPURINOL 100 MG PO TABS
100.0000 mg | ORAL_TABLET | Freq: Every day | ORAL | Status: DC
  Filled 2018-06-30: qty 1

## 2018-06-30 MED ORDER — ROCURONIUM BROMIDE 50 MG/5ML IV SOSY
PREFILLED_SYRINGE | INTRAVENOUS | Status: DC | PRN
  Administered 2018-06-30: 50 mg via INTRAVENOUS
  Administered 2018-06-30 (×3): 10 mg via INTRAVENOUS
  Administered 2018-06-30: 20 mg via INTRAVENOUS
  Administered 2018-06-30: 30 mg via INTRAVENOUS

## 2018-06-30 MED FILL — traMADol HCL 50 MG TABS: 50 | 3 days supply | Qty: 20 | Fill #0

## 2018-06-30 MED FILL — SULFAMETHOXAZOLE-TMP DS TAB: 800-160 | 3 days supply | Qty: 6 | Fill #0

## 2018-06-30 SURGICAL SUPPLY — 55 items
APPLICATOR COTTON TIP 6 STRL (MISCELLANEOUS) ×2 IMPLANT
APPLICATOR COTTON TIP 6IN STRL (MISCELLANEOUS) ×4
CATH FOLEY 2WAY SLVR 18FR 30CC (CATHETERS) ×4 IMPLANT
CATH ROBINSON RED A/P 16FR (CATHETERS) ×4 IMPLANT
CATH ROBINSON RED A/P 8FR (CATHETERS) ×4 IMPLANT
CATH TIEMANN FOLEY 18FR 5CC (CATHETERS) ×4 IMPLANT
CHLORAPREP W/TINT 26ML (MISCELLANEOUS) ×4 IMPLANT
CLIP VESOLOCK LG 6/CT PURPLE (CLIP) ×8 IMPLANT
COVER SURGICAL LIGHT HANDLE (MISCELLANEOUS) ×4 IMPLANT
COVER TIP SHEARS 8 DVNC (MISCELLANEOUS) ×2 IMPLANT
COVER TIP SHEARS 8MM DA VINCI (MISCELLANEOUS) ×2
COVER WAND RF STERILE (DRAPES) ×4 IMPLANT
CUTTER ECHEON FLEX ENDO 45 340 (ENDOMECHANICALS) ×4 IMPLANT
DECANTER SPIKE VIAL GLASS SM (MISCELLANEOUS) ×4 IMPLANT
DERMABOND ADVANCED (GAUZE/BANDAGES/DRESSINGS) ×2
DERMABOND ADVANCED .7 DNX12 (GAUZE/BANDAGES/DRESSINGS) ×2 IMPLANT
DRAPE ARM DVNC X/XI (DISPOSABLE) ×8 IMPLANT
DRAPE COLUMN DVNC XI (DISPOSABLE) ×2 IMPLANT
DRAPE DA VINCI XI ARM (DISPOSABLE) ×8
DRAPE DA VINCI XI COLUMN (DISPOSABLE) ×2
DRAPE SURG IRRIG POUCH 19X23 (DRAPES) ×4 IMPLANT
DRSG TEGADERM 4X4.75 (GAUZE/BANDAGES/DRESSINGS) ×4 IMPLANT
ELECT PENCIL ROCKER SW 15FT (MISCELLANEOUS) ×4 IMPLANT
ELECT REM PT RETURN 15FT ADLT (MISCELLANEOUS) ×4 IMPLANT
GLOVE BIO SURGEON STRL SZ 6.5 (GLOVE) ×3 IMPLANT
GLOVE BIO SURGEONS STRL SZ 6.5 (GLOVE) ×1
GLOVE BIOGEL M STRL SZ7.5 (GLOVE) ×8 IMPLANT
GOWN STRL REUS W/TWL LRG LVL3 (GOWN DISPOSABLE) ×12 IMPLANT
HOLDER FOLEY CATH W/STRAP (MISCELLANEOUS) ×4 IMPLANT
IRRIG SUCT STRYKERFLOW 2 WTIP (MISCELLANEOUS) ×4
IRRIGATION SUCT STRKRFLW 2 WTP (MISCELLANEOUS) ×2 IMPLANT
NDL SAFETY ECLIPSE 18X1.5 (NEEDLE) ×2 IMPLANT
NEEDLE HYPO 18GX1.5 SHARP (NEEDLE) ×2
PACK ROBOT UROLOGY CUSTOM (CUSTOM PROCEDURE TRAY) ×4 IMPLANT
SEAL CANN UNIV 5-8 DVNC XI (MISCELLANEOUS) ×8 IMPLANT
SEAL XI 5MM-8MM UNIVERSAL (MISCELLANEOUS) ×8
SLEEVE SURGEON STRL (DRAPES) ×4 IMPLANT
SOLUTION ELECTROLUBE (MISCELLANEOUS) ×4 IMPLANT
STAPLE RELOAD 45 GRN (STAPLE) ×2 IMPLANT
STAPLE RELOAD 45MM GREEN (STAPLE) ×2
SUT ETHILON 3 0 PS 1 (SUTURE) ×4 IMPLANT
SUT MNCRL 3 0 RB1 (SUTURE) ×2 IMPLANT
SUT MNCRL 3 0 VIOLET RB1 (SUTURE) ×2 IMPLANT
SUT MNCRL AB 4-0 PS2 18 (SUTURE) ×8 IMPLANT
SUT MONOCRYL 3 0 RB1 (SUTURE) ×4
SUT VIC AB 0 CT1 27 (SUTURE) ×2
SUT VIC AB 0 CT1 27XBRD ANTBC (SUTURE) ×2 IMPLANT
SUT VIC AB 0 UR5 27 (SUTURE) ×4 IMPLANT
SUT VIC AB 2-0 SH 27 (SUTURE) ×4
SUT VIC AB 2-0 SH 27X BRD (SUTURE) ×4 IMPLANT
SUT VICRYL 0 UR6 27IN ABS (SUTURE) ×12 IMPLANT
SYR 27GX1/2 1ML LL SAFETY (SYRINGE) ×4 IMPLANT
TOWEL OR 17X26 10 PK STRL BLUE (TOWEL DISPOSABLE) IMPLANT
TOWEL OR NON WOVEN STRL DISP B (DISPOSABLE) ×4 IMPLANT
WATER STERILE IRR 1000ML POUR (IV SOLUTION) ×8 IMPLANT

## 2018-06-30 NOTE — Progress Notes (Signed)
Patient ID: Adam Matherne., male   DOB: [birth year], 58 y.o.   MRN: [id number]  Post-op note  Subjective: The patient is doing well.  No complaints. Resting comfortably.  Objective: Vital signs in last 24 hours: Temp:  [97.5 F (36.4 C)-97.7 F (36.5 C)] 97.5 F (36.4 C) (02/03 1507) Pulse Rate:  [65-73] 69 (02/03 1545) Resp:  [14-18] 18 (02/03 1545) BP: (118-145)/(76-91) 141/77 (02/03 1545) SpO2:  [97 %-100 %] 97 % (02/03 1545) Weight:  [403 kg] 127 kg (02/03 0959)  Intake/Output from previous day: No intake/output data recorded. Intake/Output this shift: Total I/O In: 1700 [I.V.:1600; IV Piggyback:100] Out: 30 [Blood:30]  Physical Exam:  General: Alert and oriented. Abdomen: Soft, Nondistended. Incisions: Clean and dry. GU: Urine clear.  Lab Results: Recent Labs    06/30/18 1514  HGB 13.6  HCT 42.4    Assessment/Plan: POD#0   1) Continue to monitor,  Ambulate, IS   Pryor Curia. MD   LOS: 0 days   Adam Estrada 06/30/2018, 4:10 PM

## 2018-06-30 NOTE — Anesthesia Procedure Notes (Signed)
Procedure Name: Intubation Date/Time: 06/30/2018 11:07 AM Performed by: Deliah Boston, CRNA Pre-anesthesia Checklist: Patient identified, Emergency Drugs available, Suction available and Patient being monitored Patient Re-evaluated:Patient Re-evaluated prior to induction Oxygen Delivery Method: Circle system utilized Preoxygenation: Pre-oxygenation with 100% oxygen Induction Type: IV induction Ventilation: Mask ventilation without difficulty Laryngoscope Size: Mac and 4 Grade View: Grade I Tube type: Oral Tube size: 7.5 mm Number of attempts: 1 Airway Equipment and Method: Stylet and Oral airway Placement Confirmation: ETT inserted through vocal cords under direct vision,  positive ETCO2 and breath sounds checked- equal and bilateral Secured at: 22 cm Tube secured with: Tape Dental Injury: Teeth and Oropharynx as per pre-operative assessment

## 2018-06-30 NOTE — Transfer of Care (Signed)
Immediate Anesthesia Transfer of Care Note  Patient: Adam Estrada.  Procedure(s) Performed: XI ROBOTIC ASSISTED LAPAROSCOPIC RADICAL PROSTATECTOMY LEVEL 2 (N/A ) LYMPHADENECTOMY, PELVIC (Bilateral )  Patient Location: PACU  Anesthesia Type:General  Level of Consciousness: drowsy  Airway & Oxygen Therapy: Patient Spontanous Breathing and Patient connected to face mask oxygen  Post-op Assessment: Report given to RN and Post -op Vital signs reviewed and stable  Post vital signs: Reviewed and stable  Last Vitals:  Vitals Value Taken Time  BP 118/76 06/30/2018  3:07 PM  Temp    Pulse 64 06/30/2018  3:09 PM  Resp 12 06/30/2018  3:09 PM  SpO2 100 % 06/30/2018  3:09 PM  Vitals shown include unvalidated device data.  Last Pain:  Vitals:   06/30/18 0959  TempSrc:   PainSc: 0-No pain         Complications: No apparent anesthesia complications

## 2018-06-30 NOTE — Anesthesia Postprocedure Evaluation (Signed)
Anesthesia Post Note  Patient: Danh Bayus.  Procedure(s) Performed: XI ROBOTIC ASSISTED LAPAROSCOPIC RADICAL PROSTATECTOMY LEVEL 2 (N/A ) LYMPHADENECTOMY, PELVIC (Bilateral )     Patient location during evaluation: PACU Anesthesia Type: General Level of consciousness: awake and alert and oriented Pain management: pain level controlled Vital Signs Assessment: post-procedure vital signs reviewed and stable Respiratory status: spontaneous breathing, nonlabored ventilation, respiratory function stable and patient connected to nasal cannula oxygen Cardiovascular status: blood pressure returned to baseline and stable Postop Assessment: no apparent nausea or vomiting Anesthetic complications: no    Last Vitals:  Vitals:   06/30/18 1530 06/30/18 1545  BP: (!) 145/79 (!) 141/77  Pulse: 73 69  Resp: 17 18  Temp:    SpO2: 98% 97%    Last Pain:  Vitals:   06/30/18 1545  TempSrc:   PainSc: 5                  Ruqayyah Lute A.

## 2018-06-30 NOTE — Anesthesia Preprocedure Evaluation (Addendum)
Anesthesia Evaluation  Patient identified by MRN, date of birth, ID band Patient awake    Reviewed: Allergy & Precautions, NPO status , Patient's Chart, lab work & pertinent test results  Airway Mallampati: II  TM Distance: >3 FB Neck ROM: Full    Dental no notable dental hx. (+) Teeth Intact   Pulmonary neg pulmonary ROS,    Pulmonary exam normal breath sounds clear to auscultation       Cardiovascular hypertension, Normal cardiovascular exam Rhythm:Regular Rate:Normal  Exercise Stress Test 04/2017-   Blood pressure demonstrated a normal response to exercise.  There was no ST segment deviation noted during stress.  Negative, adequate stress test.     Neuro/Psych negative neurological ROS  negative psych ROS   GI/Hepatic negative GI ROS, Neg liver ROS,   Endo/Other  Morbid obesity  Renal/GU negative Renal ROS   Prostate Ca    Musculoskeletal negative musculoskeletal ROS (+)   Abdominal (+) + obese,   Peds  Hematology  (+) REFUSES BLOOD PRODUCTS, Hx/o [place] fever   Anesthesia Other Findings   Reproductive/Obstetrics                            Anesthesia Physical Anesthesia Plan  ASA: III  Anesthesia Plan: General   Post-op Pain Management:    Induction: Intravenous  PONV Risk Score and Plan: 4 or greater and Scopolamine patch - Pre-op, Midazolam, Ondansetron, Dexamethasone and Treatment may vary due to age or medical condition  Airway Management Planned: Oral ETT  Additional Equipment:   Intra-op Plan:   Post-operative Plan: Extubation in OR  Informed Consent: I have reviewed the patients History and Physical, chart, labs and discussed the procedure including the risks, benefits and alternatives for the proposed anesthesia with the patient or authorized representative who has indicated his/her understanding and acceptance.     Dental advisory given  Plan  Discussed with: CRNA and Surgeon  Anesthesia Plan Comments:        Anesthesia Quick Evaluation

## 2018-06-30 NOTE — Discharge Instructions (Signed)

## 2018-06-30 NOTE — Op Note (Signed)

## 2018-07-01 ENCOUNTER — Encounter (HOSPITAL_COMMUNITY): Payer: Self-pay | Admitting: Urology

## 2018-07-01 DIAGNOSIS — C61 Malignant neoplasm of prostate: Secondary | ICD-10-CM | POA: Diagnosis not present

## 2018-07-01 LAB — HEMOGLOBIN AND HEMATOCRIT, BLOOD
HCT: 42.7 % (ref 39.0–52.0)
HEMOGLOBIN: 14 g/dL (ref 13.0–17.0)

## 2018-07-01 MED ORDER — BISACODYL 10 MG RE SUPP
10.0000 mg | Freq: Once | RECTAL | Status: AC
  Administered 2018-07-01: 10 mg via RECTAL
  Filled 2018-07-01: qty 1

## 2018-07-01 MED ORDER — TRAMADOL HCL 50 MG PO TABS
50.0000 mg | ORAL_TABLET | Freq: Four times a day (QID) | ORAL | Status: DC | PRN
  Filled 2018-07-01 (×2): qty 1

## 2018-07-01 NOTE — Discharge Summary (Signed)
  Date of admission: 06/30/2018  Date of discharge: 07/01/2018  Admission diagnosis: Prostate Cancer  Discharge diagnosis: Prostate Cancer  History and Physical: For full details, please see admission history and physical. Briefly, Adam Estrada. is a 58 y.o. gentleman with localized prostate cancer.  After discussing management/treatment options, he elected to proceed with surgical treatment.  Hospital Course: Adam Estrada. was taken to the operating room on 06/30/2018 and underwent a robotic assisted laparoscopic radical prostatectomy. He tolerated this procedure well and without complications. Postoperatively, he was able to be transferred to a regular hospital room following recovery from anesthesia.  He was able to begin ambulating the night of surgery. He remained hemodynamically stable overnight.  He had excellent urine output with appropriately minimal output from his pelvic drain and his pelvic drain was removed on POD #1.  He was transitioned to oral pain medication, tolerated a clear liquid diet, and had met all discharge criteria and was able to be discharged home later on POD#1.  Laboratory values:  Recent Labs    06/30/18 1514 07/01/18 0420  HGB 13.6 14.0  HCT 42.4 42.7    Disposition: Home  Discharge instruction: He was instructed to be ambulatory but to refrain from heavy lifting, strenuous activity, or driving. He was instructed on urethral catheter care.  Discharge medications:   Allergies as of 07/01/2018      Reactions   Other    NO BLOOD PRODUCTS      Medication List    STOP taking these medications   Vitamin D (Ergocalciferol) 1.25 MG (50000 UT) Caps capsule Commonly known as:  DRISDOL     TAKE these medications   allopurinol 100 MG tablet Commonly known as:  ZYLOPRIM Take 1 tablet (100 mg total) by mouth daily.   colchicine 0.6 MG tablet 2 at pain inset and may repeat 1 tablet in 1 hour x1 in 24 hours What changed:    how much to take  how to  take this  when to take this  reasons to take this   sildenafil 100 MG tablet Commonly known as:  VIAGRA Take 0.5-1 tablets (50-100 mg total) by mouth daily as needed for erectile dysfunction.   sulfamethoxazole-trimethoprim 800-160 MG tablet Commonly known as:  BACTRIM DS,SEPTRA DS Take 1 tablet by mouth 2 (two) times daily. Start the day prior to foley removal appointment   traMADol 50 MG tablet Commonly known as:  ULTRAM Take 1-2 tablets (50-100 mg total) by mouth every 6 (six) hours as needed for moderate pain or severe pain.       Followup: He will followup in 1 week for catheter removal and to discuss his surgical pathology results.

## 2018-07-01 NOTE — Progress Notes (Signed)
Patient ID: Adam Estrada., male   DOB: 07-01-1960, 58 y.o.   MRN: [id number]  1 Day Post-Op Subjective: The patient is doing well.  No nausea or vomiting. Pain is adequately controlled.  Objective: Vital signs in last 24 hours: Temp:  [97.5 F (36.4 C)-99 F (37.2 C)] 99 F (37.2 C) (02/04 0423) Pulse Rate:  [63-73] 66 (02/04 0423) Resp:  [14-19] 18 (02/04 0423) BP: (118-155)/(76-91) 137/82 (02/04 0423) SpO2:  [97 %-100 %] 99 % (02/04 0423) Weight:  [709 kg] 127 kg (02/03 0959)  Intake/Output from previous day: 02/03 0701 - 02/04 0700 In: 4924.6 [P.O.:220; I.V.:3504.6; IV Piggyback:1200] Out: 2640 [Urine:2450; Drains:160; Blood:30] Intake/Output this shift: No intake/output data recorded.  Physical Exam:  General: Alert and oriented. CV: RRR Lungs: Clear bilaterally. GI: Soft, Nondistended. Incisions: Clean, dry, and intact Urine: Clear Extremities: Nontender, no erythema, no edema.  Lab Results: Recent Labs    06/30/18 1514 07/01/18 0420  HGB 13.6 14.0  HCT 42.4 42.7      Assessment/Plan: POD# 1 s/p robotic prostatectomy.  1) SL IVF 2) Ambulate, Incentive spirometry 3) Transition to oral pain medication 4) Dulcolax suppository 5) D/C pelvic drain 6) Plan for likely discharge later today   Pryor Curia. MD   LOS: 0 days   Dutch Gray 07/01/2018, 7:17 AM

## 2018-07-28 DIAGNOSIS — M62838 Other muscle spasm: Secondary | ICD-10-CM | POA: Diagnosis not present

## 2018-07-28 DIAGNOSIS — N393 Stress incontinence (female) (male): Secondary | ICD-10-CM | POA: Diagnosis not present

## 2018-07-28 DIAGNOSIS — M6281 Muscle weakness (generalized): Secondary | ICD-10-CM | POA: Diagnosis not present

## 2018-10-01 DIAGNOSIS — C61 Malignant neoplasm of prostate: Secondary | ICD-10-CM | POA: Diagnosis not present

## 2018-10-08 DIAGNOSIS — N393 Stress incontinence (female) (male): Secondary | ICD-10-CM | POA: Diagnosis not present

## 2018-10-08 DIAGNOSIS — C61 Malignant neoplasm of prostate: Secondary | ICD-10-CM | POA: Diagnosis not present

## 2018-10-08 DIAGNOSIS — N5201 Erectile dysfunction due to arterial insufficiency: Secondary | ICD-10-CM | POA: Diagnosis not present

## 2019-05-13 DIAGNOSIS — C61 Malignant neoplasm of prostate: Secondary | ICD-10-CM | POA: Diagnosis not present

## 2019-05-20 DIAGNOSIS — C61 Malignant neoplasm of prostate: Secondary | ICD-10-CM | POA: Diagnosis not present

## 2019-11-18 DIAGNOSIS — C61 Malignant neoplasm of prostate: Secondary | ICD-10-CM | POA: Diagnosis not present

## 2019-11-19 DIAGNOSIS — H5213 Myopia, bilateral: Secondary | ICD-10-CM | POA: Diagnosis not present

## 2019-11-19 DIAGNOSIS — H524 Presbyopia: Secondary | ICD-10-CM | POA: Diagnosis not present

## 2019-11-25 DIAGNOSIS — C61 Malignant neoplasm of prostate: Secondary | ICD-10-CM | POA: Diagnosis not present

## 2019-11-25 DIAGNOSIS — N5201 Erectile dysfunction due to arterial insufficiency: Secondary | ICD-10-CM | POA: Diagnosis not present

## 2020-06-27 DIAGNOSIS — C61 Malignant neoplasm of prostate: Secondary | ICD-10-CM | POA: Diagnosis not present

## 2020-06-27 DIAGNOSIS — N5201 Erectile dysfunction due to arterial insufficiency: Secondary | ICD-10-CM | POA: Diagnosis not present

## 2020-11-04 ENCOUNTER — Ambulatory Visit: Payer: 59 | Attending: Internal Medicine

## 2020-11-04 DIAGNOSIS — Z23 Encounter for immunization: Secondary | ICD-10-CM

## 2020-11-04 NOTE — Progress Notes (Signed)
   [id number]  Name:  Adam Estrada.    MRN: [id number] DOB: [birth year]  11/04/2020  Adam Estrada was observed post Covid-19 immunization for 15 minutes without incident. He was provided with Vaccine Information Sheet and instruction to access the V-Safe system.   Adam Estrada was instructed to call 911 with any severe reactions post vaccine: Difficulty breathing  Swelling of face and throat  A fast heartbeat  A bad rash all over body  Dizziness and weakness   Immunizations Administered     Name Date Dose VIS Date Route   PFIZER Comrnaty(Gray TOP) Covid-19 Vaccine 11/04/2020  9:29 AM 0.3 mL 05/05/2020 Intramuscular   Manufacturer: [place]   Lot: [id number]   [place]: [phone]

## 2020-11-08 ENCOUNTER — Other Ambulatory Visit (HOSPITAL_BASED_OUTPATIENT_CLINIC_OR_DEPARTMENT_OTHER): Payer: Self-pay

## 2020-11-08 MED ORDER — COVID-19 MRNA VAC-TRIS(PFIZER) 30 MCG/0.3ML IM SUSP
INTRAMUSCULAR | 0 refills | Status: DC
  Filled 2020-11-08: qty 0.3, 1d supply, fill #0

## 2021-02-17 ENCOUNTER — Ambulatory Visit: Payer: 59 | Attending: Internal Medicine

## 2021-02-17 ENCOUNTER — Other Ambulatory Visit (HOSPITAL_BASED_OUTPATIENT_CLINIC_OR_DEPARTMENT_OTHER): Payer: Self-pay

## 2021-02-17 DIAGNOSIS — Z23 Encounter for immunization: Secondary | ICD-10-CM

## 2021-02-17 MED ORDER — INFLUENZA VAC SPLIT QUAD 0.5 ML IM SUSY
PREFILLED_SYRINGE | INTRAMUSCULAR | 0 refills | Status: DC
  Filled 2021-02-17: qty 0.5, 1d supply, fill #0

## 2021-02-17 NOTE — Progress Notes (Signed)
   [id number]  Name:  Kaniel Kiang.    MRN: [id number] DOB: 12-06-1960  02/17/2021  Mr. Geesey was observed post Covid-19 immunization for 15 minutes without incident. He was provided with Vaccine Information Sheet and instruction to access the V-Safe system.   Mr. Carriere was instructed to call 911 with any severe reactions post vaccine: Difficulty breathing  Swelling of face and throat  A fast heartbeat  A bad rash all over body  Dizziness and weakness

## 2021-02-23 ENCOUNTER — Other Ambulatory Visit (HOSPITAL_BASED_OUTPATIENT_CLINIC_OR_DEPARTMENT_OTHER): Payer: Self-pay

## 2021-02-23 MED ORDER — COVID-19MRNA BIVAL VACC PFIZER 30 MCG/0.3ML IM SUSP
INTRAMUSCULAR | 0 refills | Status: DC
  Filled 2021-02-23: qty 0.3, 1d supply, fill #0

## 2021-03-28 DIAGNOSIS — H524 Presbyopia: Secondary | ICD-10-CM | POA: Diagnosis not present

## 2021-03-28 DIAGNOSIS — H5213 Myopia, bilateral: Secondary | ICD-10-CM | POA: Diagnosis not present

## 2021-08-07 ENCOUNTER — Encounter: Payer: Self-pay | Admitting: Gastroenterology

## 2022-02-14 ENCOUNTER — Encounter: Payer: Self-pay | Admitting: Family Medicine

## 2022-02-14 ENCOUNTER — Other Ambulatory Visit (HOSPITAL_BASED_OUTPATIENT_CLINIC_OR_DEPARTMENT_OTHER): Payer: Self-pay

## 2022-02-14 ENCOUNTER — Ambulatory Visit: Payer: 59 | Admitting: Family Medicine

## 2022-02-14 VITALS — BP 124/82 | HR 74 | Temp 97.5°F | Ht 71.0 in | Wt 294.6 lb

## 2022-02-14 DIAGNOSIS — Z1211 Encounter for screening for malignant neoplasm of colon: Secondary | ICD-10-CM

## 2022-02-14 DIAGNOSIS — Z8546 Personal history of malignant neoplasm of prostate: Secondary | ICD-10-CM | POA: Diagnosis not present

## 2022-02-14 DIAGNOSIS — N5231 Erectile dysfunction following radical prostatectomy: Secondary | ICD-10-CM | POA: Insufficient documentation

## 2022-02-14 DIAGNOSIS — E119 Type 2 diabetes mellitus without complications: Secondary | ICD-10-CM | POA: Insufficient documentation

## 2022-02-14 DIAGNOSIS — Z131 Encounter for screening for diabetes mellitus: Secondary | ICD-10-CM | POA: Diagnosis not present

## 2022-02-14 DIAGNOSIS — Z13220 Encounter for screening for lipoid disorders: Secondary | ICD-10-CM

## 2022-02-14 DIAGNOSIS — M1000 Idiopathic gout, unspecified site: Secondary | ICD-10-CM | POA: Diagnosis not present

## 2022-02-14 DIAGNOSIS — Z1159 Encounter for screening for other viral diseases: Secondary | ICD-10-CM | POA: Diagnosis not present

## 2022-02-14 DIAGNOSIS — M109 Gout, unspecified: Secondary | ICD-10-CM | POA: Insufficient documentation

## 2022-02-14 LAB — PSA: PSA: 0 ng/mL — ABNORMAL LOW (ref 0.10–4.00)

## 2022-02-14 LAB — BASIC METABOLIC PANEL
BUN: 17 mg/dL (ref 6–23)
CO2: 25 mEq/L (ref 19–32)
Calcium: 9.2 mg/dL (ref 8.4–10.5)
Chloride: 105 mEq/L (ref 96–112)
Creatinine, Ser: 1.09 mg/dL (ref 0.40–1.50)
GFR: 74.68 mL/min (ref >60.00)
Glucose, Bld: 239 mg/dL — ABNORMAL HIGH (ref 70–99)
Potassium: 4.2 mEq/L (ref 3.5–5.1)
Sodium: 138 mEq/L (ref 135–145)

## 2022-02-14 LAB — LIPID PANEL
Cholesterol: 147 mg/dL (ref 0–200)
HDL: 37.1 mg/dL — ABNORMAL LOW (ref >39.00)
LDL Cholesterol: 80 mg/dL (ref 0–99)
NonHDL: 110.24
Total CHOL/HDL Ratio: 4
Triglycerides: 153 mg/dL — ABNORMAL HIGH (ref 0.0–149.0)
VLDL: 30.6 mg/dL (ref 0.0–40.0)

## 2022-02-14 LAB — CBC
HCT: 41.6 % (ref 39.0–52.0)
Hemoglobin: 13.9 g/dL (ref 13.0–17.0)
MCHC: 33.3 g/dL (ref 30.0–36.0)
MCV: 84.2 fl (ref 78.0–100.0)
Platelets: 256 10*3/uL (ref 150.0–400.0)
RBC: 4.94 Mil/uL (ref 4.22–5.81)
RDW: 14.9 % (ref 11.5–15.5)
WBC: 6.8 10*3/uL (ref 4.0–10.5)

## 2022-02-14 LAB — HEMOGLOBIN A1C: Hgb A1c MFr Bld: 8.3 % — ABNORMAL HIGH (ref 4.6–6.5)

## 2022-02-14 LAB — URIC ACID: Uric Acid, Serum: 7.7 mg/dL (ref 4.0–7.8)

## 2022-02-14 MED ORDER — SILDENAFIL CITRATE 100 MG PO TABS
50.0000 mg | ORAL_TABLET | Freq: Every day | ORAL | 1 refills | Status: DC | PRN
  Filled 2022-02-14: qty 5, 25d supply, fill #0

## 2022-02-14 NOTE — Progress Notes (Signed)
[facility] [address] [place] [address] Dept: [phone] Dept Fax: [phone]  Transfer of Care Office Visit  Subjective:    Patient ID: Adam Estrada., male    DOB: 01/21/1961, 61 y.o..   MRN: [id number]  Chief Complaint  Patient presents with   Establish Care    NP-establish care.  No concerns.      History of Present Illness:  Patient is in today to establish care. Mr. Adam Estrada was born in [place]. He split his time as a child living in [place] and [place], [place]. He did some college courses, but did not complete a degree. He has been married for 37 years. He has two daughters (23, 19). 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 He denies any tobacco, alcohol, or drug use.  Mr. Adam Estrada has a history of prostate cancer. He underwent a radical prostatectomy 3 years ago. He follows with Dr. Alinda Money (urology). He notes he is due for a follow up PSA. He has had some resulting ED. He was prescribed Viagra, but notes he has never taken it. He would like a new prescription, as He feels he wants to see if this would help.  Mr. Adam Estrada has a history of gout. He has been prescribed allopurinol and colchicine, which he uses for flares. He has worked to determine what food triggers he has, so he can avoid these foods. This has decreased his gout flares.   Past Medical History: Patient Active Problem List   Diagnosis Date Noted   Gout 02/14/2022   Erectile dysfunction after radical prostatectomy 02/14/2022   History of prostate cancer 06/30/2018   Past Surgical History:  Procedure Laterality Date   ACHILLES TENDON REPAIR     CIRCUMCISION     as an adult    COLONOSCOPY W/ POLYPECTOMY  2016?   LYMPHADENECTOMY Bilateral 06/30/2018   Procedure: LYMPHADENECTOMY, PELVIC;  Surgeon: Raynelle Bring, MD;  Location: [facility];  Service: Urology;  Laterality: Bilateral;   PROSTATE BIOPSY     ROBOT ASSISTED LAPAROSCOPIC RADICAL  PROSTATECTOMY N/A 06/30/2018   Procedure: XI ROBOTIC ASSISTED LAPAROSCOPIC RADICAL PROSTATECTOMY LEVEL 2;  Surgeon: Raynelle Bring, MD;  Location: [facility];  Service: Urology;  Laterality: N/A;   VASECTOMY     Family History  Problem Relation Age of Onset   Asthma Mother    Alcohol abuse Father    Pneumonia Father    Heart disease Maternal Aunt    Breast cancer Maternal Aunt    Stroke Maternal Uncle    Heart disease Maternal Grandmother    Heart disease Maternal Grandfather    Cancer Maternal Grandfather        Prostate   Cancer Paternal Grandfather        Prostate   Heart disease Paternal Grandfather    Heart disease Other    Colon cancer Neg Hx    Outpatient Medications Prior to Visit  Medication Sig Dispense Refill   allopurinol (ZYLOPRIM) 100 MG tablet Take 1 tablet (100 mg total) by mouth daily. 30 tablet 6   colchicine 0.6 MG tablet 2 at pain inset and may repeat 1 tablet in 1 hour x1 in 24 hours (Patient taking differently: Take 0.6 mg by mouth daily as needed (gout). 2 at pain inset and may repeat 1 tablet in 1 hour x1 in 24 hours) 30 tablet 1   sildenafil (VIAGRA) 100 MG tablet Take 0.5-1 tablets (50-100 mg total) by mouth daily as  needed for erectile dysfunction. 5 tablet 1   COVID-19 mRNA bivalent vaccine, Pfizer, injection Inject into the muscle. 0.3 mL 0   COVID-19 mRNA Vac-TriS, Pfizer, SUSP injection Inject into the muscle. 0.3 mL 0   influenza vac split quadrivalent PF (FLUARIX) 0.5 ML injection Inject into the muscle. 0.5 mL 0   sulfamethoxazole-trimethoprim (BACTRIM DS,SEPTRA DS) 800-160 MG tablet Take 1 tablet by mouth 2 (two) times daily. Start the day prior to foley removal appointment 6 tablet 0   traMADol (ULTRAM) 50 MG tablet Take 1-2 tablets (50-100 mg total) by mouth every 6 (six) hours as needed for moderate pain or severe pain. 20 tablet 0   No facility-administered medications prior to visit.   Allergies  Allergen Reactions   Other     NO BLOOD PRODUCTS     Objective:   Today's Vitals   02/14/22 0912  BP: 124/82  Pulse: 74  Temp: (!) 97.5 F (36.4 C)  TempSrc: Temporal  SpO2: 96%  Weight: 294 lb 9.6 oz (133.6 kg)  Height: '5\' 11"'$  (1.803 m)   Body mass index is 41.09 kg/m.   General: Well developed, well nourished. No acute distress. Psych: Alert and oriented. Normal mood and affect.  Health Maintenance Due  Topic Date Due   HIV Screening  Never done   Hepatitis C Screening  Never done   COLONOSCOPY (Pts 45-46yr Insurance coverage will need to be confirmed)  07/17/2019   COVID-19 Vaccine (3 - Pfizer risk series) 03/17/2021   INFLUENZA VACCINE  12/26/2021     Assessment & Plan:   1. History of prostate cancer I will check a PSA today and recommend continued follow-up with urology.  - PSA - CBC  2. Idiopathic gout, unspecified chronicity, unspecified site I will check a uric acid level to see if he may need to be on allopurinol more chronically.  - Uric acid  3. Erectile dysfunction after radical prostatectomy I will renew his Viagra prescription to see if this will be effective for him.  - sildenafil (VIAGRA) 100 MG tablet; Take 0.5-1 tablets (50-100 mg total) by mouth daily as needed for erectile dysfunction.  Dispense: 5 tablet; Refill: 1  4. Encounter for hepatitis C screening test for low risk patient  - HCV Ab w Reflex to Quant PCR  5. Screening for lipid disorders  - Lipid panel  6. Screening for diabetes mellitus (DM)  - Basic metabolic panel - Hemoglobin A1c  7. Screening for colon cancer  - Ambulatory referral to Gastroenterology  Return for Annual physical.   SHaydee Salter MD

## 2022-02-15 LAB — HCV INTERPRETATION

## 2022-02-15 LAB — HCV AB W REFLEX TO QUANT PCR: HCV Ab: NONREACTIVE

## 2022-02-21 ENCOUNTER — Encounter: Payer: Self-pay | Admitting: Family Medicine

## 2022-02-21 ENCOUNTER — Ambulatory Visit (INDEPENDENT_AMBULATORY_CARE_PROVIDER_SITE_OTHER): Payer: 59 | Admitting: Family Medicine

## 2022-02-21 ENCOUNTER — Encounter: Payer: 59 | Admitting: Family Medicine

## 2022-02-21 ENCOUNTER — Other Ambulatory Visit (HOSPITAL_BASED_OUTPATIENT_CLINIC_OR_DEPARTMENT_OTHER): Payer: Self-pay

## 2022-02-21 VITALS — BP 128/89 | HR 77 | Temp 96.7°F | Ht 71.0 in | Wt 290.6 lb

## 2022-02-21 DIAGNOSIS — Z6841 Body Mass Index (BMI) 40.0 and over, adult: Secondary | ICD-10-CM | POA: Diagnosis not present

## 2022-02-21 DIAGNOSIS — E119 Type 2 diabetes mellitus without complications: Secondary | ICD-10-CM

## 2022-02-21 DIAGNOSIS — M79662 Pain in left lower leg: Secondary | ICD-10-CM

## 2022-02-21 DIAGNOSIS — Z0000 Encounter for general adult medical examination without abnormal findings: Secondary | ICD-10-CM

## 2022-02-21 DIAGNOSIS — E6609 Other obesity due to excess calories: Secondary | ICD-10-CM | POA: Insufficient documentation

## 2022-02-21 LAB — URINALYSIS, ROUTINE W REFLEX MICROSCOPIC
Bilirubin Urine: NEGATIVE
Hgb urine dipstick: NEGATIVE
Ketones, ur: NEGATIVE
Leukocytes,Ua: NEGATIVE
Nitrite: NEGATIVE
Specific Gravity, Urine: 1.025 (ref 1.000–1.030)
Total Protein, Urine: NEGATIVE
Urine Glucose: NEGATIVE
Urobilinogen, UA: 0.2 (ref 0.0–1.0)
pH: 6 (ref 5.0–8.0)

## 2022-02-21 LAB — MICROALBUMIN / CREATININE URINE RATIO
Creatinine,U: 146.6 mg/dL
Microalb Creat Ratio: 1.8 mg/g (ref 0.0–30.0)
Microalb, Ur: 2.7 mg/dL — ABNORMAL HIGH (ref 0.0–1.9)

## 2022-02-21 MED ORDER — METFORMIN HCL 500 MG PO TABS
500.0000 mg | ORAL_TABLET | Freq: Every day | ORAL | 3 refills | Status: DC
  Filled 2022-02-21: qty 90, 90d supply, fill #0

## 2022-02-21 NOTE — Progress Notes (Signed)
Adam Estrada Adam Estrada [place] [address] Dept: [phone] Dept Fax: [phone]  Annual Physical Visit  Subjective:    Patient ID: Adam Ice., male    DOB: 06-07-1960, 61 y.o..   MRN: [id number]  Chief Complaint  Patient presents with   Annual Exam    CPE having pain back of right leg x 3 days. Left leg feels weak started today . Not fasting    History of Present Illness:  Patient is in today for an annual physical/preventative visit.  Adam Estrada had been in recently to establish care. Blood work had revealed that he has developed Type 2 diabetes. He has started to make some initial adjustments in his diet until he could get in to discuss this.  Review of Systems  Constitutional:  Negative for chills, fever, malaise/fatigue and weight loss.  HENT:  Negative for congestion, ear pain, hearing loss, sinus pain, sore throat and tinnitus.   Eyes:  Negative for blurred vision, pain, discharge and redness.  Respiratory:  Negative for cough, shortness of breath and wheezing.   Cardiovascular:  Negative for chest pain, palpitations and leg swelling.  Gastrointestinal:  Positive for abdominal pain. Negative for constipation, diarrhea, heartburn, nausea and vomiting.       Notes he gets occasional, brief, sharp abdominal pains that are not localized.   Musculoskeletal:  Positive for myalgias. Negative for back pain, joint pain and neck pain.       Notes pain over left calf area. He has a history of a prior Achilles tear. He notes that while recovering form this, he developed a blood clot in the leg. He took injections for about a month afterwards to resolve. He notes his current pain feels similar tot hat.  Skin:  Negative for itching and rash.  Endo/Heme/Allergies:  Negative for environmental allergies.  Psychiatric/Behavioral:  Negative for depression. The patient is not nervous/anxious.    Past Medical  History: Patient Active Problem List   Diagnosis Date Noted   Gout 02/14/2022   Erectile dysfunction after radical prostatectomy 02/14/2022   Type 2 diabetes mellitus (Adam Estrada) 02/14/2022   History of prostate cancer 06/30/2018   Past Surgical History:  Procedure Laterality Date   ACHILLES TENDON REPAIR     CIRCUMCISION     as an adult    COLONOSCOPY W/ POLYPECTOMY  2016?   LYMPHADENECTOMY Bilateral 06/30/2018   Procedure: LYMPHADENECTOMY, PELVIC;  Surgeon: Raynelle Bring, MD;  Location: [facility];  Service: Urology;  Laterality: Bilateral;   PROSTATE BIOPSY     ROBOT ASSISTED LAPAROSCOPIC RADICAL PROSTATECTOMY N/A 06/30/2018   Procedure: XI ROBOTIC ASSISTED LAPAROSCOPIC RADICAL PROSTATECTOMY LEVEL 2;  Surgeon: Raynelle Bring, MD;  Location: [facility];  Service: Urology;  Laterality: N/A;   VASECTOMY     Family History  Problem Relation Age of Onset   Asthma Mother    Alcohol abuse Father    Pneumonia Father    Heart disease Maternal Aunt    Breast cancer Maternal Aunt    Stroke Maternal Uncle    Heart disease Maternal Grandmother    Heart disease Maternal Grandfather    Cancer Maternal Grandfather        Prostate   Cancer Paternal Grandfather        Prostate   Heart disease Paternal Grandfather    Heart disease Other    Colon cancer Neg Hx    Outpatient Medications Prior to Visit  Medication Sig Dispense Refill   allopurinol (  ZYLOPRIM) 100 MG tablet Take 1 tablet (100 mg total) by mouth daily. 30 tablet 6   sildenafil (VIAGRA) 100 MG tablet Take 0.5-1 tablets (50-100 mg total) by mouth daily as needed for erectile dysfunction. 5 tablet 1   colchicine 0.6 MG tablet 2 at pain inset and may repeat 1 tablet in 1 hour x1 in 24 hours (Patient not taking: Reported on 02/21/2022) 30 tablet 1   No facility-administered medications prior to visit.   Allergies  Allergen Reactions   Other     NO BLOOD PRODUCTS      Objective:   Today's Vitals   02/21/22 1427  BP: (!) 145/83  Pulse: 77   Temp: (!) 96.7 F (35.9 C)  TempSrc: Temporal  SpO2: 97%  Weight: 290 lb 9.6 oz (131.8 kg)  Height: '5\' 11"'$  (1.803 m)   Body mass index is 40.53 kg/m.   General: Obese. No acute distress. HEENT: Normocephalic, non-traumatic. PERRL, EOMI. Conjunctiva clear. Fundiscopic exam   shows normal disc and vasculature. External ears normal. EAC and TMs normal bilaterally.   Nose clear without congestion or rhinorrhea. Mucous membranes moist. Oropharynx clear.   Good dentition. Neck: Supple. No lymphadenopathy. No thyromegaly. Lungs: Clear to auscultation bilaterally. No wheezing, rales or rhonchi. CV: RRR without murmurs or rubs. Pulses 2+ bilaterally. Abdomen: Soft, non-tender. Bowel sounds positive, normal pitch and frequency. No   hepatosplenomegaly. No rebound or guarding. Extremities: Full ROM. No joint swelling or tenderness. No tenderness to palpation of the left   calf. No swelling or edema noted. Feet- Skin intact. No sign of maceration between toes. Nails are normal. Dorsalis pedis and   posterior tibial artery pulses are normal. 5.07 monofilament testing normal. Skin: Warm and dry. No rashes. Psych: Alert and oriented. Normal mood and affect.  Health Maintenance Due  Topic Date Due   OPHTHALMOLOGY EXAM  Never done   HIV Screening  Never done   Diabetic kidney evaluation - Urine ACR  Never done   COLONOSCOPY (Pts 45-34yr Insurance coverage will need to be confirmed)  07/17/2019   INFLUENZA VACCINE  12/26/2021     Lab Results: Lab Results  Component Value Date   HGBA1C 8.3 (H) 02/14/2022   Lab Results  Component Value Date   CHOL 147 02/14/2022   HDL 37.10 (L) 02/14/2022   LDLCALC 80 02/14/2022   TRIG 153.0 (H) 02/14/2022   CHOLHDL 4 02/14/2022      Latest Ref Rng & Units 02/14/2022    9:45 AM 06/23/2018   12:33 PM 11/14/2017    5:37 PM  CMP  Glucose 70 - 99 mg/dL 239  106  86   BUN 6 - 23 mg/dL '17  12  12   '$ Creatinine 0.40 - 1.50 mg/dL 1.09  1.21  1.08   Sodium  135 - 145 mEq/L 138  139  141   Potassium 3.5 - 5.1 mEq/L 4.2  4.0  4.5   Chloride 96 - 112 mEq/L 105  108  104   CO2 19 - 32 mEq/L '25  24  22   '$ Calcium 8.4 - 10.5 mg/dL 9.2  9.1  9.6   Total Protein 6.0 - 8.5 g/dL   7.0   Total Bilirubin 0.0 - 1.2 mg/dL   0.3   Alkaline Phos 39 - 117 IU/L   109   AST 0 - 40 IU/L   19   ALT 0 - 44 IU/L   31    Assessment & Plan:   1.  Annual physical exam Reviewed indicated screening tests and immunizations.  2. Type 2 diabetes mellitus without complication, without long-term current use of insulin (HCC) Discussed recently diagnosed diabetes. Reviewed basic pathophysiology, initial medications, and targets of therapy. Began discussion of screening for complications. He notes his annual eye exam will be due in the next 2 months.  - Microalbumin / creatinine urine ratio - Urinalysis, Routine w reflex microscopic - metFORMIN (GLUCOPHAGE) 500 MG tablet; Take 1 tablet (500 mg total) by mouth daily with breakfast.  Dispense: 180 tablet; Refill: 3 - Ambulatory referral to diabetic education  3. Morbid obesity with BMI of 40.0-44.9, adult [facility]) Encouraged him to start doing some regular walking as an initial approach to weight loss.  4. Pain of left calf His description of a blood clot raises concern for past DVT, but it would have been atypical to treat for only 30 days. This may have been more of a superficial phlebitis instead. Exam is not concerning. I will check a D-dimer.  - D-Dimer, Quantitative  Return in about 1 month (around 03/23/2022) for Reassessment.   Haydee Salter, MD

## 2022-02-22 LAB — D-DIMER, QUANTITATIVE: D-Dimer, Quant: 0.53 mcg/mL FEU — ABNORMAL HIGH (ref <0.50)

## 2022-02-22 NOTE — Addendum Note (Signed)
Addended by: Haydee Salter on: 02/22/2022 08:33 AM   Modules accepted: Orders

## 2022-03-16 ENCOUNTER — Other Ambulatory Visit (HOSPITAL_BASED_OUTPATIENT_CLINIC_OR_DEPARTMENT_OTHER): Payer: Self-pay

## 2022-03-16 MED ORDER — FLUARIX QUADRIVALENT 0.5 ML IM SUSY
PREFILLED_SYRINGE | INTRAMUSCULAR | 0 refills | Status: DC
  Filled 2022-03-16: qty 0.5, 1d supply, fill #0

## 2022-03-16 MED ORDER — COMIRNATY 30 MCG/0.3ML IM SUSY
PREFILLED_SYRINGE | INTRAMUSCULAR | 0 refills | Status: DC
  Filled 2022-03-16: qty 0.3, 1d supply, fill #0

## 2022-03-27 ENCOUNTER — Ambulatory Visit: Payer: 59 | Admitting: Family Medicine

## 2022-03-27 ENCOUNTER — Telehealth: Payer: Self-pay

## 2022-03-27 ENCOUNTER — Encounter: Payer: Self-pay | Admitting: Family Medicine

## 2022-03-27 ENCOUNTER — Other Ambulatory Visit (HOSPITAL_COMMUNITY): Payer: Self-pay

## 2022-03-27 VITALS — BP 130/78 | HR 79 | Temp 98.2°F | Ht 71.0 in | Wt 284.0 lb

## 2022-03-27 DIAGNOSIS — E119 Type 2 diabetes mellitus without complications: Secondary | ICD-10-CM | POA: Diagnosis not present

## 2022-03-27 DIAGNOSIS — L918 Other hypertrophic disorders of the skin: Secondary | ICD-10-CM | POA: Diagnosis not present

## 2022-03-27 DIAGNOSIS — Z23 Encounter for immunization: Secondary | ICD-10-CM | POA: Diagnosis not present

## 2022-03-27 LAB — GLUCOSE, POCT (MANUAL RESULT ENTRY): POC Glucose: 117 mg/dl — AB (ref 70–99)

## 2022-03-27 MED ORDER — METFORMIN HCL ER 500 MG PO TB24
500.0000 mg | ORAL_TABLET | Freq: Every day | ORAL | 3 refills | Status: AC
  Filled 2022-03-27 – 2022-05-11 (×2): qty 90, 90d supply, fill #0
  Filled 2022-08-08: qty 90, 90d supply, fill #1

## 2022-03-27 NOTE — Telephone Encounter (Signed)
Can you please check on the Ultrasound that was ordered on 02/22/22. Patient hasn't heard anything about this.  Thanks. Dm/cma

## 2022-03-27 NOTE — Progress Notes (Signed)
[facility] [address] [place] [address] Dept: [phone] Dept Fax: [phone]  Chronic Care Office Visit  Subjective:    Patient ID: Adam Estrada., male    DOB: [birth year], 61 y.o..   MRN: [id number]  Chief Complaint  Patient presents with   Follow-up    1 month f/u. Still having some irritation in the leg     History of Present Illness:  Patient is in today for reassessment of chronic medical issues.  Mr. Zoeller was recently diagnosed with Type 2 diabetes. He is now on metformin 500 mg daily. He notes he has been having some diarrhea since starting the medication. He finds this is generally tolerable. He is scheduled for a diabetes education appointment in 2 weeks. He also has a pending eye appointment.  Past Medical History: Patient Active Problem List   Diagnosis Date Noted   Morbid obesity with BMI of 40.0-44.9, adult ([place]) 02/21/2022   Gout 02/14/2022   Erectile dysfunction after radical prostatectomy 02/14/2022   Type 2 diabetes mellitus ([place]) 02/14/2022   History of prostate cancer 06/30/2018   Past Surgical History:  Procedure Laterality Date   ACHILLES TENDON REPAIR     CIRCUMCISION     as an adult    COLONOSCOPY W/ POLYPECTOMY  2016?   LYMPHADENECTOMY Bilateral 06/30/2018   Procedure: LYMPHADENECTOMY, PELVIC;  Surgeon: Raynelle Bring, MD;  Location: [facility];  Service: Urology;  Laterality: Bilateral;   PROSTATE BIOPSY     ROBOT ASSISTED LAPAROSCOPIC RADICAL PROSTATECTOMY N/A 06/30/2018   Procedure: XI ROBOTIC ASSISTED LAPAROSCOPIC RADICAL PROSTATECTOMY LEVEL 2;  Surgeon: Raynelle Bring, MD;  Location: [facility];  Service: Urology;  Laterality: N/A;   VASECTOMY     Family History  Problem Relation Age of Onset   Asthma Mother    Alcohol abuse Father    Pneumonia Father    Heart disease Maternal Aunt    Breast cancer Maternal Aunt    Stroke Maternal Uncle    Heart disease Maternal Grandmother     Heart disease Maternal Grandfather    Cancer Maternal Grandfather        Prostate   Cancer Paternal Grandfather        Prostate   Heart disease Paternal Grandfather    Heart disease Other    Colon cancer Neg Hx    Outpatient Medications Prior to Visit  Medication Sig Dispense Refill   allopurinol (ZYLOPRIM) 100 MG tablet Take 1 tablet (100 mg total) by mouth daily. 30 tablet 6   colchicine 0.6 MG tablet 2 at pain inset and may repeat 1 tablet in 1 hour x1 in 24 hours (Patient not taking: Reported on 02/21/2022) 30 tablet 1   COVID-19 mRNA vaccine 2023-2024 (COMIRNATY) syringe Inject into the muscle. 0.3 mL 0   influenza vac split quadrivalent PF (FLUARIX QUADRIVALENT) 0.5 ML injection Inject into the muscle. 0.5 mL 0   sildenafil (VIAGRA) 100 MG tablet Take 0.5-1 tablets (50-100 mg total) by mouth daily as needed for erectile dysfunction. 5 tablet 1   metFORMIN (GLUCOPHAGE) 500 MG tablet Take 1 tablet (500 mg total) by mouth daily with breakfast. 180 tablet 3   No facility-administered medications prior to visit.   Allergies  Allergen Reactions   Other     NO BLOOD PRODUCTS    Objective:   Today's Vitals   03/27/22 1509  BP: 130/78  Pulse: 79  Temp: 98.2 F (36.8 C)  TempSrc: Oral  SpO2: 94%  Weight: 284 lb (128.8 kg)  Height: '5\' 11"'$  (1.803 m)   Body mass index is 39.61 kg/m.   General: Well developed, well nourished. No acute distress. Skin: Multiple skin tags noted around the neck and in the right axilla. Psych: Alert and oriented. Normal mood and affect.  Health Maintenance Due  Topic Date Due   OPHTHALMOLOGY EXAM  Never done   HIV Screening  Never done   COLONOSCOPY (Pts 45-59yr Insurance coverage will need to be confirmed)  07/17/2019   Lab Results Last hemoglobin A1c Lab Results  Component Value Date   HGBA1C 8.3 (H) 02/14/2022   Component Ref Range & Units 15:48  POC Glucose 70 - 99 mg/dl 117 Abnormal        Assessment & Plan:   1. Type 2  diabetes mellitus without complication, without long-term current use of insulin ([facility] Mr. RAmadon blood sugar today is improved. I will plan to swithc him tot he XR formulaiton of metformin to see if his GI symptoms will improve. I will plan to see him back in 2 months for a repeat A1c.  - POCT Glucose (CBG) - metFORMIN (GLUCOPHAGE-XR) 500 MG 24 hr tablet; Take 1 tablet (500 mg total) by mouth daily with breakfast.  Dispense: 90 tablet; Refill: 3  2. Acrochordon Patient will plan to return for skin tag removal.  3. Need for pneumococcal 20-valent conjugate vaccination  - Pneumococcal conjugate vaccine 20-valent (Prevnar 20)  Return in about 2 months (around 05/27/2022) for Reassessment.   SHaydee Salter MD

## 2022-03-28 NOTE — Telephone Encounter (Signed)
[facility] is [phone] for scheduling.

## 2022-04-10 DIAGNOSIS — H5213 Myopia, bilateral: Secondary | ICD-10-CM | POA: Diagnosis not present

## 2022-04-10 DIAGNOSIS — H524 Presbyopia: Secondary | ICD-10-CM | POA: Diagnosis not present

## 2022-04-10 LAB — HM DIABETES EYE EXAM

## 2022-04-11 ENCOUNTER — Ambulatory Visit: Payer: 59 | Admitting: Dietician

## 2022-04-12 ENCOUNTER — Other Ambulatory Visit (HOSPITAL_COMMUNITY): Payer: Self-pay

## 2022-04-13 ENCOUNTER — Encounter (HOSPITAL_COMMUNITY): Payer: 59

## 2022-04-13 ENCOUNTER — Encounter: Payer: Self-pay | Admitting: Family Medicine

## 2022-04-13 ENCOUNTER — Ambulatory Visit (HOSPITAL_COMMUNITY)
Admission: RE | Admit: 2022-04-13 | Discharge: 2022-04-13 | Disposition: A | Discharge status: Home / Self Care | Payer: 59 | Source: Ambulatory Visit | Attending: Family Medicine | Admitting: Family Medicine

## 2022-04-13 DIAGNOSIS — M79662 Pain in left lower leg: Secondary | ICD-10-CM | POA: Diagnosis not present

## 2022-04-27 ENCOUNTER — Ambulatory Visit (INDEPENDENT_AMBULATORY_CARE_PROVIDER_SITE_OTHER): Payer: 59

## 2022-04-27 DIAGNOSIS — Z23 Encounter for immunization: Secondary | ICD-10-CM | POA: Diagnosis not present

## 2022-04-30 ENCOUNTER — Ambulatory Visit (AMBULATORY_SURGERY_CENTER): Payer: 59 | Admitting: *Deleted

## 2022-04-30 ENCOUNTER — Other Ambulatory Visit (HOSPITAL_BASED_OUTPATIENT_CLINIC_OR_DEPARTMENT_OTHER): Payer: Self-pay

## 2022-04-30 VITALS — Ht 71.0 in | Wt 287.0 lb

## 2022-04-30 DIAGNOSIS — K625 Hemorrhage of anus and rectum: Secondary | ICD-10-CM

## 2022-04-30 DIAGNOSIS — Z86010 Personal history of colonic polyps: Secondary | ICD-10-CM

## 2022-04-30 MED ORDER — NA SULFATE-K SULFATE-MG SULF 17.5-3.13-1.6 GM/177ML PO SOLN
1.0000 | Freq: Once | ORAL | 0 refills | Status: AC
  Filled 2022-04-30 – 2022-05-11 (×2): qty 354, 1d supply, fill #0

## 2022-04-30 NOTE — Progress Notes (Addendum)
No egg or soy allergy known to patient  No issues known to pt with past sedation with any surgeries or procedures Patient denies ever being told they had issues or difficulty with intubation  No FH of Malignant Hyperthermia Pt is not on diet pills Pt is not on  home 02  Pt is not on blood thinners  Pt denies issues with constipation  Pt encouraged to use to use Singlecare or Goodrx to reduce cost  Pt instructed not to take po diabetic meds day of procedure. Pt agreed not to Patient's chart reviewed by Adam Estrada CNRA prior to previsit and patient appropriate for the [place].  Previsit completed and red dot placed by patient's name on their procedure day (on provider's schedule).  . In person

## 2022-05-07 ENCOUNTER — Other Ambulatory Visit (HOSPITAL_BASED_OUTPATIENT_CLINIC_OR_DEPARTMENT_OTHER): Payer: Self-pay

## 2022-05-08 ENCOUNTER — Encounter: Payer: Self-pay | Admitting: Family Medicine

## 2022-05-08 ENCOUNTER — Encounter: Payer: Self-pay | Admitting: Internal Medicine

## 2022-05-08 ENCOUNTER — Ambulatory Visit: Payer: 59 | Admitting: Family Medicine

## 2022-05-08 VITALS — BP 122/76 | HR 74 | Temp 97.6°F | Ht 71.0 in | Wt 287.6 lb

## 2022-05-08 DIAGNOSIS — L918 Other hypertrophic disorders of the skin: Secondary | ICD-10-CM

## 2022-05-08 NOTE — Progress Notes (Signed)
[facility] [address] [place] [address] Dept: [phone] Dept Fax: [phone]  Office Visit  Subjective:    Patient ID: Adam Ice., male    DOB: Dec 31, 1960, 61 y.o..   MRN: [id number]  Chief Complaint  Patient presents with   Follow-up    Skin tag removal.      History of Present Illness:  Patient is in today for removal of multiple skin tags. We discussed this at a recent visit. He has had others in the past that needed removed.  Past Medical History: Patient Active Problem List   Diagnosis Date Noted   Morbid obesity with BMI of 40.0-44.9, adult ([place]) 02/21/2022   Gout 02/14/2022   Erectile dysfunction after radical prostatectomy 02/14/2022   Type 2 diabetes mellitus ([place]) 02/14/2022   History of prostate cancer 06/30/2018   Past Surgical History:  Procedure Laterality Date   ACHILLES TENDON REPAIR     CIRCUMCISION     as an adult    COLONOSCOPY W/ POLYPECTOMY  2016?   LYMPHADENECTOMY Bilateral 06/30/2018   Procedure: LYMPHADENECTOMY, PELVIC;  Surgeon: Raynelle Bring, MD;  Location: [facility];  Service: Urology;  Laterality: Bilateral;   PROSTATE BIOPSY     ROBOT ASSISTED LAPAROSCOPIC RADICAL PROSTATECTOMY N/A 06/30/2018   Procedure: XI ROBOTIC ASSISTED LAPAROSCOPIC RADICAL PROSTATECTOMY LEVEL 2;  Surgeon: Raynelle Bring, MD;  Location: [facility];  Service: Urology;  Laterality: N/A;   VASECTOMY     Family History  Problem Relation Age of Onset   Asthma Mother    Alcohol abuse Father    Pneumonia Father    Heart disease Maternal Aunt    Breast cancer Maternal Aunt    Stroke Maternal Uncle    Heart disease Maternal Grandmother    Colon polyps Maternal Grandfather    Heart disease Maternal Grandfather    Cancer Maternal Grandfather        Prostate   Cancer Paternal Grandfather        Prostate   Heart disease Paternal Grandfather    Heart disease Other    Colon cancer Neg Hx    Esophageal  cancer Neg Hx    Rectal cancer Neg Hx    Stomach cancer Neg Hx    Outpatient Medications Prior to Visit  Medication Sig Dispense Refill   allopurinol (ZYLOPRIM) 100 MG tablet Take 1 tablet (100 mg total) by mouth daily. 30 tablet 6   colchicine 0.6 MG tablet 2 at pain inset and may repeat 1 tablet in 1 hour x1 in 24 hours 30 tablet 1   metFORMIN (GLUCOPHAGE-XR) 500 MG 24 hr tablet Take 1 tablet (500 mg total) by mouth daily with breakfast. 90 tablet 3   sildenafil (VIAGRA) 100 MG tablet Take 0.5-1 tablets (50-100 mg total) by mouth daily as needed for erectile dysfunction. 5 tablet 1   COVID-19 mRNA vaccine 2023-2024 (COMIRNATY) syringe Inject into the muscle. 0.3 mL 0   influenza vac split quadrivalent PF (FLUARIX QUADRIVALENT) 0.5 ML injection Inject into the muscle. 0.5 mL 0   No facility-administered medications prior to visit.   Allergies  Allergen Reactions   Other     NO BLOOD PRODUCTS     Objective:   Today's Vitals   05/08/22 1056  BP: 122/76  Pulse: 74  Temp: 97.6 F (36.4 C)  TempSrc: Temporal  SpO2: 94%  Weight: 287 lb 9.6 oz (130.5 kg)  Height: '5\' 11"'$  (1.803 m)   Body mass index is  40.11 kg/m.   General: Well developed, well nourished. No acute distress. Skin: Warm and dry. There are multiple skin tags present, 1 right neck, 9 left neck, and 1 left axilla. Psych: Alert and oriented. Normal mood and affect.  Health Maintenance Due  Topic Date Due   HIV Screening  Never done   COLONOSCOPY (Pts 45-54yr Insurance coverage will need to be confirmed)  07/17/2019   COVID-19 Vaccine (4 - 2023-24 season) 05/11/2022   PROCEDURE- Skin tag removal Indication: Acrochordon Anesthesia: Local, Lidocaine 1%  PARQ reviewed with patient. Consent signed. Each skin tag was cleaned with an alcohol wipe. Anesthetized with a small bleb of 1% lidocaine. Eleven tags excised with suture scissors. Sites that bled afterwards were cauterized with a hand-held Bovie unit. Patient  tolerated procedure well. Routine wound care instructions reviewed with patient.    Assessment & Plan:   1. Acrochordon Removed tags as noted above. Routine skin care. Follow-up if an signs or symptoms of infection.   Return if symptoms worsen or fail to improve.   SHaydee Salter MD

## 2022-05-11 ENCOUNTER — Other Ambulatory Visit (HOSPITAL_BASED_OUTPATIENT_CLINIC_OR_DEPARTMENT_OTHER): Payer: Self-pay

## 2022-05-14 ENCOUNTER — Ambulatory Visit (AMBULATORY_SURGERY_CENTER): Payer: 59 | Admitting: Internal Medicine

## 2022-05-14 ENCOUNTER — Encounter: Payer: Self-pay | Admitting: Internal Medicine

## 2022-05-14 ENCOUNTER — Encounter: Payer: Self-pay | Admitting: Family Medicine

## 2022-05-14 VITALS — BP 139/87 | HR 65 | Temp 97.7°F | Resp 12 | Ht 71.0 in | Wt 287.0 lb

## 2022-05-14 DIAGNOSIS — K635 Polyp of colon: Secondary | ICD-10-CM | POA: Diagnosis not present

## 2022-05-14 DIAGNOSIS — D125 Benign neoplasm of sigmoid colon: Secondary | ICD-10-CM | POA: Diagnosis not present

## 2022-05-14 DIAGNOSIS — Z86010 Personal history of colonic polyps: Secondary | ICD-10-CM

## 2022-05-14 DIAGNOSIS — Z1211 Encounter for screening for malignant neoplasm of colon: Secondary | ICD-10-CM | POA: Diagnosis not present

## 2022-05-14 DIAGNOSIS — E119 Type 2 diabetes mellitus without complications: Secondary | ICD-10-CM | POA: Diagnosis not present

## 2022-05-14 DIAGNOSIS — Z09 Encounter for follow-up examination after completed treatment for conditions other than malignant neoplasm: Secondary | ICD-10-CM | POA: Diagnosis not present

## 2022-05-14 DIAGNOSIS — D123 Benign neoplasm of transverse colon: Secondary | ICD-10-CM | POA: Diagnosis not present

## 2022-05-14 DIAGNOSIS — I10 Essential (primary) hypertension: Secondary | ICD-10-CM | POA: Diagnosis not present

## 2022-05-14 MED ORDER — SODIUM CHLORIDE 0.9 % IV SOLN: 500.0000 mL | Freq: Once | INTRAVENOUS | Status: AC

## 2022-05-14 NOTE — Progress Notes (Signed)
Sedate, gd SR, tolerated procedure well, VSS, report to RN 

## 2022-05-14 NOTE — Op Note (Signed)
[facility] Patient Name: Adam Estrada Procedure Date: 05/14/2022 8:51 AM MRN: [id number] Endoscopist: Jerene Bears , MD, [id number] Age: 61 Referring MD:  Date of Birth: Jun 01, 1960 Gender: Male Account #: [id number] Procedure:                Colonoscopy Indications:              High risk colon cancer surveillance: Personal                            history of non-advanced adenoma, Last colonoscopy:                            February 2016 Medicines:                Monitored Anesthesia Care Procedure:                Pre-Anesthesia Assessment:                           - Prior to the procedure, a History and Physical                            was performed, and patient medications and                            allergies were reviewed. The patient's tolerance of                            previous anesthesia was also reviewed. The risks                            and benefits of the procedure and the sedation                            options and risks were discussed with the patient.                            All questions were answered, and informed consent                            was obtained. Prior Anticoagulants: The patient has                            taken no anticoagulant or antiplatelet agents. ASA                            Grade Assessment: II - A patient with mild systemic                            disease. After reviewing the risks and benefits,                            the patient was deemed in satisfactory condition to  undergo the procedure.                           After obtaining informed consent, the colonoscope                            was passed under direct vision. Throughout the                            procedure, the patient's blood pressure, pulse, and                            oxygen saturations were monitored continuously. The                            Olympus CF-HQ190L ([id number]) Colonoscope was                             introduced through the anus and advanced to the                            cecum, identified by appendiceal orifice and                            ileocecal valve. The colonoscopy was performed                            without difficulty. The patient tolerated the                            procedure well. The quality of the bowel                            preparation was good. The ileocecal valve,                            appendiceal orifice, and rectum were photographed. Scope In: 8:56:40 AM Scope Out: 9:12:19 AM Scope Withdrawal Time: 0 hours 13 minutes 30 seconds  Total Procedure Duration: 0 hours 15 minutes 39 seconds  Findings:                 The digital rectal exam was normal.                           Two sessile polyps were found in the transverse                            colon. The polyps were 4 to 5 mm in size. These                            polyps were removed with a cold snare. Resection                            and retrieval were complete.  A 5 mm polyp was found in the distal sigmoid colon.                            The polyp was flat. The polyp was removed with a                            cold snare. Resection and retrieval were complete.                           Internal hemorrhoids were found during                            retroflexion. The hemorrhoids were small. Complications:            No immediate complications. Estimated Blood Loss:     Estimated blood loss was minimal. Impression:               - Two 4 to 5 mm polyps in the transverse colon,                            removed with a cold snare. Resected and retrieved.                           - One 5 mm polyp in the distal sigmoid colon,                            removed with a cold snare. Resected and retrieved.                           - Small internal hemorrhoids. Recommendation:           - Patient has a contact number available for                             emergencies. The signs and symptoms of potential                            delayed complications were discussed with the                            patient. Return to normal activities tomorrow.                            Written discharge instructions were provided to the                            patient.                           - Resume previous diet.                           - Continue present medications.                           -  Await pathology results.                           - Repeat colonoscopy is recommended for                            surveillance. The colonoscopy date will be                            determined after pathology results from today's                            exam become available for review. Jerene Bears, MD 05/14/2022 9:14:59 AM This report has been signed electronically.

## 2022-05-14 NOTE — Progress Notes (Signed)
GASTROENTEROLOGY PROCEDURE H&P NOTE   Primary Care Physician: Haydee Salter, MD    Reason for Procedure:   Hx of adenomatous polyp of colon  Plan:    colonooscopy  Patient is appropriate for endoscopic procedure(s) in the ambulatory ([facility]) setting.  The nature of the procedure, as well as the risks, benefits, and alternatives were carefully and thoroughly reviewed with the patient. Ample time for discussion and questions allowed. The patient understood, was satisfied, and agreed to proceed.     HPI: Adam Estrada. is a 61 y.o. male who presents for colonoscopy.  Medical history as below.  Tolerated the prep.  No recent chest pain or shortness of breath.  No abdominal pain today.  Past Medical History:  Diagnosis Date   Allergy    Arthritis    Bradycardia    Chest pain    Diabetes mellitus without complication ([place])    DVT (deep venous thrombosis) ([place])    reports developed post-op achilles surgery   HTN (hypertension)    [place] fever    reports contracted while living in [place], [place] had some respiratory issues but was treated; reports only residual is nodules on his lungs    Prostate cancer [facility])     Past Surgical History:  Procedure Laterality Date   ACHILLES TENDON REPAIR     CIRCUMCISION     as an adult    COLONOSCOPY W/ POLYPECTOMY  2016?   LYMPHADENECTOMY Bilateral 06/30/2018   Procedure: LYMPHADENECTOMY, PELVIC;  Surgeon: Raynelle Bring, MD;  Location: [facility];  Service: Urology;  Laterality: Bilateral;   PROSTATE BIOPSY     ROBOT ASSISTED LAPAROSCOPIC RADICAL PROSTATECTOMY N/A 06/30/2018   Procedure: XI ROBOTIC ASSISTED LAPAROSCOPIC RADICAL PROSTATECTOMY LEVEL 2;  Surgeon: Raynelle Bring, MD;  Location: [facility];  Service: Urology;  Laterality: N/A;   VASECTOMY      Prior to Admission medications   Medication Sig Start Date End Date Taking? Authorizing Provider  metFORMIN (GLUCOPHAGE-XR) 500 MG 24 hr tablet Take 1 tablet (500 mg total) by  mouth daily with breakfast. 03/27/22  Yes Haydee Salter, MD  allopurinol (ZYLOPRIM) 100 MG tablet Take 1 tablet (100 mg total) by mouth daily. Patient not taking: Reported on 05/14/2022 07/10/17   Chevis Pretty, FNP  colchicine 0.6 MG tablet 2 at pain inset and may repeat 1 tablet in 1 hour x1 in 24 hours Patient not taking: Reported on 05/14/2022 07/10/17   Chevis Pretty, FNP  sildenafil (VIAGRA) 100 MG tablet Take 0.5-1 tablets (50-100 mg total) by mouth daily as needed for erectile dysfunction. 02/14/22   Haydee Salter, MD    Current Outpatient Medications  Medication Sig Dispense Refill   metFORMIN (GLUCOPHAGE-XR) 500 MG 24 hr tablet Take 1 tablet (500 mg total) by mouth daily with breakfast. 90 tablet 3   allopurinol (ZYLOPRIM) 100 MG tablet Take 1 tablet (100 mg total) by mouth daily. (Patient not taking: Reported on 05/14/2022) 30 tablet 6   colchicine 0.6 MG tablet 2 at pain inset and may repeat 1 tablet in 1 hour x1 in 24 hours (Patient not taking: Reported on 05/14/2022) 30 tablet 1   sildenafil (VIAGRA) 100 MG tablet Take 0.5-1 tablets (50-100 mg total) by mouth daily as needed for erectile dysfunction. 5 tablet 1   Current Facility-Administered Medications  Medication Dose Route Frequency Provider Last Rate Last Admin   0.9 %  sodium chloride infusion  500 mL Intravenous Once Tyreik Delahoussaye, Lajuan Lines, MD  Allergies as of 05/14/2022 - Review Complete 05/14/2022  Allergen Reaction Noted   Other  06/23/2018    Family History  Problem Relation Age of Onset   Asthma Mother    Alcohol abuse Father    Pneumonia Father    Heart disease Maternal Aunt    Breast cancer Maternal Aunt    Stroke Maternal Uncle    Heart disease Maternal Grandmother    Colon polyps Maternal Grandfather    Heart disease Maternal Grandfather    Cancer Maternal Grandfather        Prostate   Cancer Paternal Grandfather        Prostate   Heart disease Paternal Grandfather    Heart disease  Other    Colon cancer Neg Hx    Esophageal cancer Neg Hx    Rectal cancer Neg Hx    Stomach cancer Neg Hx     Social History   Socioeconomic History   Marital status: Married    Spouse name: Not on file   Number of children: 2   Years of education: Not on file   Highest education level: Not on file  Occupational History   Occupation: Courier    Comment: [place]  Tobacco Use   Smoking status: Never   Smokeless tobacco: Never  Vaping Use   Vaping Use: Never used  Substance and Sexual Activity   Alcohol use: No    Alcohol/week: 0.0 standard drinks of alcohol   Drug use: No   Sexual activity: Yes  Other Topics Concern   Not on file  Social History Narrative   Married. Education: [employer].    Social Determinants of Health   Financial Resource Strain: Not on file  Food Insecurity: Not on file  Transportation Needs: Not on file  Physical Activity: Not on file  Stress: Not on file  Social Connections: Not on file  Intimate Partner Violence: Not on file    Physical Exam: Vital signs in last 24 hours: '@BP'$  120/74   Pulse 63   Temp 97.7 F (36.5 C) (Skin)   Ht '5\' 11"'$  (1.803 m)   Wt 287 lb (130.2 kg)   SpO2 99%   BMI 40.03 kg/m  GEN: NAD EYE: Sclerae anicteric ENT: MMM CV: Non-tachycardic Pulm: CTA b/l GI: Soft, NT/ND NEURO:  Alert & Oriented x 3   Zenovia Jarred, MD [facility]  05/14/2022 8:47 AM

## 2022-05-14 NOTE — Progress Notes (Signed)
Pt's states no medical or surgical changes since previsit or office visit. VS by DT. °

## 2022-05-14 NOTE — Patient Instructions (Signed)
Read all of the handouts given to you by your recovery room nurse.  Resume all of your previous medications as ordered.  YOU HAD AN ENDOSCOPIC PROCEDURE TODAY AT [facility]:   Refer to the procedure report that was given to you for any specific questions about what was found during the examination.  If the procedure report does not answer your questions, please call your gastroenterologist to clarify.  If you requested that your care partner not be given the details of your procedure findings, then the procedure report has been included in a sealed envelope for you to review at your convenience later.  YOU SHOULD EXPECT: Some feelings of bloating in the abdomen. Passage of more gas than usual.  Walking can help get rid of the air that was put into your GI tract during the procedure and reduce the bloating. If you had a lower endoscopy (such as a colonoscopy or flexible sigmoidoscopy) you may notice spotting of blood in your stool or on the toilet paper. If you underwent a bowel prep for your procedure, you may not have a normal bowel movement for a few days.  Please Note:  You might notice some irritation and congestion in your nose or some drainage.  This is from the oxygen used during your procedure.  There is no need for concern and it should clear up in a day or so.  SYMPTOMS TO REPORT IMMEDIATELY:  Following lower endoscopy (colonoscopy or flexible sigmoidoscopy):  Excessive amounts of blood in the stool  Significant tenderness or worsening of abdominal pains  Swelling of the abdomen that is new, acute  Fever of 100F or higher   For urgent or emergent issues, a gastroenterologist can be reached at any hour by calling [phone]. Do not use MyChart messaging for urgent concerns.    DIET:  We do recommend a small meal at first, but then you may proceed to your regular diet.  Drink plenty of fluids but you should avoid alcoholic beverages for 24 hours.  ACTIVITY:  You  should plan to take it easy for the rest of today and you should NOT DRIVE or use heavy machinery until tomorrow (because of the sedation medicines used during the test).    FOLLOW UP: Our staff will call the number listed on your records the next business day following your procedure.  We will call around 7:15- 8:00 am to check on you and address any questions or concerns that you may have regarding the information given to you following your procedure. If we do not reach you, we will leave a message.     If any biopsies were taken you will be contacted by phone or by letter within the next 1-3 weeks.  Please call us at [phone] if you have not heard about the biopsies in 3 weeks.    SIGNATURES/CONFIDENTIALITY: You and/or your care partner have signed paperwork which will be entered into your electronic medical record.  These signatures attest to the fact that that the information above on your After Visit Summary has been reviewed and is understood.  Full responsibility of the confidentiality of this discharge information lies with you and/or your care-partner.

## 2022-05-14 NOTE — Progress Notes (Signed)
Called to room to assist during endoscopic procedure.  Patient ID and intended procedure confirmed with present staff. Received instructions for my participation in the procedure from the performing physician.  

## 2022-05-15 ENCOUNTER — Telehealth: Payer: Self-pay

## 2022-05-15 NOTE — Telephone Encounter (Signed)
Follow up call to pt, no answer. 

## 2022-05-16 ENCOUNTER — Encounter: Payer: Self-pay | Admitting: Dietician

## 2022-05-16 ENCOUNTER — Encounter: Payer: 59 | Attending: Family Medicine | Admitting: Dietician

## 2022-05-16 VITALS — Ht 71.0 in

## 2022-05-16 DIAGNOSIS — E119 Type 2 diabetes mellitus without complications: Secondary | ICD-10-CM | POA: Diagnosis not present

## 2022-05-16 NOTE — Patient Instructions (Signed)
Aim for 150 minutes of physical activity weekly. Goal: Walk with wife 1x/wk for 30 minutes Goal: get gym membership when the new year starts and go 2x/wk for 60 minutes  Goal: aim to make 1/2 of your plate vegetables at least 2x/day  Make simple meals at home more often than eating out.  Aim to eat within 1-2 hours of waking up and every 3-5 hours following.

## 2022-05-16 NOTE — Progress Notes (Signed)
Diabetes Self-Management Education  Visit Type: First/Initial  Appt. Start Time: 1625 Appt. End Time: 1720  05/16/2022  Mr. Adam Estrada, identified by name and date of birth, is a 61 y.o. male with a diagnosis of Diabetes: Type 2.   ASSESSMENT  Primary concern: Pt wants to bring his A1c down with diet and exercise.   History includes: allergies, arthritis, cancer, type 2 diabetes, HTN.  Labs noted: A1c 8.3% 02/14/22 Medications include: metformin Supplements: none  Pt states he has an active job. He plans on joining a gym when the new year starts.   Pt reports metformin has been giving him some diarrhea.   Pt states sometimes he will skip lunch but has been trying to eat more consistently since his diabetes diagnosis.  Pt states before his diabetes diagnosis he drank a lot of juice but recently started drinking sugar free juices.   Height '5\' 11"'$  (1.803 m). Body mass index is 40.03 kg/m.   Diabetes Self-Management Education - 05/16/22 1627       Visit Information   Visit Type First/Initial      Initial Visit   Diabetes Type Type 2    Date Diagnosed 01/2022    Are you currently following a meal plan? No      Health Coping   How would you rate your overall health? Good      Psychosocial Assessment   Patient Belief/Attitude about Diabetes Motivated to manage diabetes    What is the hardest part about your diabetes right now, causing you the most concern, or is the most worrisome to you about your diabetes?   Making healty food and beverage choices    Self-care barriers None    Self-management support Doctor's office    Other persons present Patient    Patient Concerns Healthy Lifestyle    Special Needs None    Preferred Learning Style No preference indicated    Learning Readiness Ready    How often do you need to have someone help you when you read instructions, pamphlets, or other written materials from your doctor or pharmacy? 1 - Never    What is the last  grade level you completed in school? 12th grade      Pre-Education Assessment   Patient understands the diabetes disease and treatment process. Needs Instruction    Patient understands incorporating nutritional management into lifestyle. Needs Instruction    Patient undertands incorporating physical activity into lifestyle. Needs Instruction    Patient understands using medications safely. Needs Instruction    Patient understands monitoring blood glucose, interpreting and using results Needs Instruction    Patient understands prevention, detection, and treatment of acute complications. Needs Instruction    Patient understands prevention, detection, and treatment of chronic complications. Needs Instruction    Patient understands how to develop strategies to address psychosocial issues. Needs Instruction    Patient understands how to develop strategies to promote health/change behavior. Needs Instruction      Complications   Last HgB A1C per patient/outside source 8.3 %    How often do you check your blood sugar? 0 times/day (not testing)    Fasting Blood glucose range (mg/dL) 70-129    Have you had a dilated eye exam in the past 12 months? Yes    Have you had a dental exam in the past 12 months? No    Are you checking your feet? No      Dietary Intake   Breakfast bacon egg and cheese sandwich  Snack (morning) none    Lunch meat and vegetable and starch    Snack (afternoon) chips    Dinner meat (chicken or beef) with starch and vegetables (rice or potato)    Snack (evening) ice cream OR none    Beverage(s) diet lemonade, water,      Activity / Exercise   Activity / Exercise Type ADL's    How many days per week do you exercise? 0    How many minutes per day do you exercise? 0    Total minutes per week of exercise 0      Patient Education   Previous Diabetes Education No    Disease Pathophysiology Definition of diabetes, type 1 and 2, and the diagnosis of diabetes;Factors that  contribute to the development of diabetes;Explored patient's options for treatment of their diabetes    Healthy Eating Role of diet in the treatment of diabetes and the relationship between the three main macronutrients and blood glucose level;Food label reading, portion sizes and measuring food.;Plate Method;Reviewed blood glucose goals for pre and post meals and how to evaluate the patients' food intake on their blood glucose level.;Meal timing in regards to the patients' current diabetes medication.;Information on hints to eating out and maintain blood glucose control.;Meal options for control of blood glucose level and chronic complications.    Being Active Role of exercise on diabetes management, blood pressure control and cardiac health.;Identified with patient nutritional and/or medication changes necessary with exercise.;Helped patient identify appropriate exercises in relation to his/her diabetes, diabetes complications and other health issue.    Medications Reviewed patients medication for diabetes, action, purpose, timing of dose and side effects.;Reviewed medication adjustment guidelines for hyperglycemia and sick days.    Monitoring Identified appropriate SMBG and/or A1C goals.;Daily foot exams;Yearly dilated eye exam    Acute complications Taught prevention, symptoms, and  treatment of hypoglycemia - the 15 rule.;Discussed and identified patients' prevention, symptoms, and treatment of hyperglycemia.;Educated on sick day management    Chronic complications Relationship between chronic complications and blood glucose control;Assessed and discussed foot care and prevention of foot problems;Lipid levels, blood glucose control and heart disease;Identified and discussed with patient  current chronic complications;Retinopathy and reason for yearly dilated eye exams;Dental care;Nephropathy, what it is, prevention of, the use of ACE, ARB's and early detection of through urine microalbumia.;Reviewed with  patient heart disease, higher risk of, and prevention    Diabetes Stress and Support Identified and addressed patients feelings and concerns about diabetes;Worked with patient to identify barriers to care and solutions;Role of stress on diabetes    Lifestyle and Health Coping Lifestyle issues that need to be addressed for better diabetes care      Individualized Goals (developed by patient)   Nutrition General guidelines for healthy choices and portions discussed    Physical Activity Exercise 3-5 times per week;30 minutes per day    Medications take my medication as prescribed    Monitoring  Not Applicable    Problem Solving Eating Pattern    Reducing Risk examine blood glucose patterns;do foot checks daily;treat hypoglycemia with 15 grams of carbs if blood glucose less than '70mg'$ /dL    Health Coping Ask for help with psychological, social, or emotional issues      Post-Education Assessment   Patient understands the diabetes disease and treatment process. Comprehends key points    Patient understands incorporating nutritional management into lifestyle. Comprehends key points    Patient undertands incorporating physical activity into lifestyle. Comprehends key points  Patient understands using medications safely. Demonstrates understanding / competency    Patient understands monitoring blood glucose, interpreting and using results Comprehends key points    Patient understands prevention, detection, and treatment of acute complications. Comprehends key points    Patient understands prevention, detection, and treatment of chronic complications. Comprehends key points    Patient understands how to develop strategies to address psychosocial issues. Comprehends key points    Patient understands how to develop strategies to promote health/change behavior. Demonstrates understanding / competency      Outcomes   Expected Outcomes Demonstrated interest in learning. Expect positive outcomes    Future  DMSE 2 months    Program Status Completed             Individualized Plan for Diabetes Self-Management Training:   Learning Objective:  Patient will have a greater understanding of diabetes self-management. Patient education plan is to attend individual and/or group sessions per assessed needs and concerns.   Plan:   Patient Instructions  Aim for 150 minutes of physical activity weekly. Goal: Walk with wife 1x/wk for 30 minutes Goal: get gym membership when the new year starts and go 2x/wk for 60 minutes  Goal: aim to make 1/2 of your plate vegetables at least 2x/day  Make simple meals at home more often than eating out.  Aim to eat within 1-2 hours of waking up and every 3-5 hours following.    Expected Outcomes:  Demonstrated interest in learning. Expect positive outcomes  Education material provided: ADA - How to Thrive: A Guide for Your Journey with Diabetes, My Plate, and Snack sheet  If problems or questions, patient to contact team via:  Phone  Future DSME appointment: 2 months

## 2022-05-17 ENCOUNTER — Encounter: Payer: Self-pay | Admitting: Internal Medicine

## 2022-05-29 ENCOUNTER — Ambulatory Visit (INDEPENDENT_AMBULATORY_CARE_PROVIDER_SITE_OTHER): Payer: Commercial Managed Care - PPO | Admitting: Family Medicine

## 2022-05-29 ENCOUNTER — Other Ambulatory Visit (HOSPITAL_BASED_OUTPATIENT_CLINIC_OR_DEPARTMENT_OTHER): Payer: Self-pay

## 2022-05-29 ENCOUNTER — Encounter: Payer: Self-pay | Admitting: Family Medicine

## 2022-05-29 VITALS — BP 120/78 | HR 85 | Temp 97.4°F | Ht 71.0 in | Wt 284.0 lb

## 2022-05-29 DIAGNOSIS — E119 Type 2 diabetes mellitus without complications: Secondary | ICD-10-CM

## 2022-05-29 DIAGNOSIS — N5231 Erectile dysfunction following radical prostatectomy: Secondary | ICD-10-CM

## 2022-05-29 DIAGNOSIS — E785 Hyperlipidemia, unspecified: Secondary | ICD-10-CM

## 2022-05-29 LAB — HEMOGLOBIN A1C: Hgb A1c MFr Bld: 7.4 % — ABNORMAL HIGH (ref 4.6–6.5)

## 2022-05-29 LAB — GLUCOSE, RANDOM: Glucose, Bld: 112 mg/dL — ABNORMAL HIGH (ref 70–99)

## 2022-05-29 MED ORDER — ATORVASTATIN CALCIUM 20 MG PO TABS
20.0000 mg | ORAL_TABLET | Freq: Every day | ORAL | 3 refills | Status: AC
  Filled 2022-05-29 – 2022-06-07 (×2): qty 90, 90d supply, fill #0

## 2022-05-29 MED ORDER — TADALAFIL 5 MG PO TABS
5.0000 mg | ORAL_TABLET | Freq: Every day | ORAL | 11 refills | Status: AC
  Filled 2022-05-29 – 2022-06-07 (×2): qty 30, 30d supply, fill #0
  Filled 2022-08-08: qty 30, 30d supply, fill #1

## 2022-05-29 NOTE — Progress Notes (Signed)
Adam Estrada Adam Estrada Dept: [phone] Dept Fax: [phone]  Chronic Care Office Visit  Subjective:    Patient ID: Adam Estrada., male    DOB: 01-31-1961, 62 y.o..   MRN: [id number]  Chief Complaint  Patient presents with   Follow-up    2 month f/u. No concerns.  Fasting today.     History of Present Illness:  Patient is in today for reassessment of chronic medical issues.  Adam Estrada was diagnosed with Type 2 diabetes in Sept. He is now on metformin 500 mg daily. I had written a prescription for him to switch over to the XR formulation to help with GI issues. He notes he had not used up his previous prescription , so has not made the change yet. His GI symptoms are improved.  Adam Estrada has a history of ED s/p radical prostatectomy. He has not found the Viagra to be very helpful. He had discussed with the urologist to switching to a daily low-dose Cialis for this.  Past Medical History: Patient Active Problem List   Diagnosis Date Noted   Dyslipidemia 05/29/2022   History of colon polyps 05/14/2022   Morbid obesity with BMI of 40.0-44.9, adult ([place]) 02/21/2022   Gout 02/14/2022   Erectile dysfunction after radical prostatectomy 02/14/2022   Type 2 diabetes mellitus ([place]) 02/14/2022   History of prostate cancer 06/30/2018   Past Surgical History:  Procedure Laterality Date   ACHILLES TENDON REPAIR     CIRCUMCISION     as an adult    COLONOSCOPY W/ POLYPECTOMY  2016?   LYMPHADENECTOMY Bilateral 06/30/2018   Procedure: LYMPHADENECTOMY, PELVIC;  Surgeon: Raynelle Bring, MD;  Location: [facility];  Service: Urology;  Laterality: Bilateral;   PROSTATE BIOPSY     ROBOT ASSISTED LAPAROSCOPIC RADICAL PROSTATECTOMY N/A 06/30/2018   Procedure: XI ROBOTIC ASSISTED LAPAROSCOPIC RADICAL PROSTATECTOMY LEVEL 2;  Surgeon: Raynelle Bring, MD;  Location: [facility];  Service: Urology;  Laterality: N/A;    VASECTOMY     Family History  Problem Relation Age of Onset   Asthma Mother    Alcohol abuse Father    Pneumonia Father    Heart disease Maternal Aunt    Breast cancer Maternal Aunt    Stroke Maternal Uncle    Heart disease Maternal Grandmother    Colon polyps Maternal Grandfather    Heart disease Maternal Grandfather    Cancer Maternal Grandfather        Prostate   Cancer Paternal Grandfather        Prostate   Heart disease Paternal Grandfather    Heart disease Other    Colon cancer Neg Hx    Esophageal cancer Neg Hx    Rectal cancer Neg Hx    Stomach cancer Neg Hx    Outpatient Medications Prior to Visit  Medication Sig Dispense Refill   metFORMIN (GLUCOPHAGE-XR) 500 MG 24 hr tablet Take 1 tablet (500 mg total) by mouth daily with breakfast. 90 tablet 3   sildenafil (VIAGRA) 100 MG tablet Take 0.5-1 tablets (50-100 mg total) by mouth daily as needed for erectile dysfunction. 5 tablet 1   allopurinol (ZYLOPRIM) 100 MG tablet Take 1 tablet (100 mg total) by mouth daily. (Patient not taking: Reported on 05/14/2022) 30 tablet 6   colchicine 0.6 MG tablet 2 at pain inset and may repeat 1 tablet in 1 hour x1 in 24 hours (Patient not taking: Reported on 05/14/2022) 30 tablet 1  Facility-Administered Medications Prior to Visit  Medication Dose Route Frequency Provider Last Rate Last Admin   0.9 %  sodium chloride infusion  500 mL Intravenous Once Pyrtle, Lajuan Lines, MD       Allergies  Allergen Reactions   Other     NO BLOOD PRODUCTS   Objective:   Today's Vitals   05/29/22 1034  BP: 120/78  Pulse: 85  Temp: (!) 97.4 F (36.3 C)  TempSrc: Temporal  SpO2: 95%  Weight: 284 lb (128.8 kg)  Height: '5\' 11"'$  (1.803 m)   Body mass index is 39.61 kg/m.   General: Well developed, well nourished. No acute distress. Psych: Alert and oriented. Normal mood and affect.  Health Maintenance Due  Topic Date Due   HIV Screening  Never done     Lab Results: Lab Results  Component  Value Date   CHOL 147 02/14/2022   HDL 37.10 (L) 02/14/2022   LDLCALC 80 02/14/2022   TRIG 153.0 (H) 02/14/2022   CHOLHDL 4 02/14/2022    Assessment & Plan:   1. Type 2 diabetes mellitus without complication, without long-term current use of insulin (HCC) We will check an A1c today to see if we are getting to goal. Plan to continue metformin 500 mg daily.  - Glucose, random - Hemoglobin A1c  2. Erectile dysfunction after radical prostatectomy I will switch Adam Estrada to a daily Cialis as recommended by his urologist.  - tadalafil (CIALIS) 5 MG tablet; Take 1 tablet (5 mg total) by mouth daily.  Dispense: 30 tablet; Refill: 11  3. Dyslipidemia We discussed risk for CV disease associated with diabetes. I reviewed approaches to lower heart disease risk. I will start him on a daily statin.  - atorvastatin (LIPITOR) 20 MG tablet; Take 1 tablet (20 mg total) by mouth daily.  Dispense: 90 tablet; Refill: 3   Return in about 3 months (around 08/28/2022) for Reassessment.   Haydee Salter, MD

## 2022-06-07 ENCOUNTER — Other Ambulatory Visit (HOSPITAL_BASED_OUTPATIENT_CLINIC_OR_DEPARTMENT_OTHER): Payer: Self-pay

## 2022-07-16 ENCOUNTER — Other Ambulatory Visit (HOSPITAL_COMMUNITY): Payer: Self-pay

## 2022-07-16 ENCOUNTER — Other Ambulatory Visit (HOSPITAL_BASED_OUTPATIENT_CLINIC_OR_DEPARTMENT_OTHER): Payer: Self-pay

## 2022-07-16 ENCOUNTER — Telehealth: Payer: Self-pay

## 2022-07-16 DIAGNOSIS — M1000 Idiopathic gout, unspecified site: Secondary | ICD-10-CM

## 2022-07-16 MED ORDER — ALLOPURINOL 100 MG PO TABS
100.0000 mg | ORAL_TABLET | Freq: Every day | ORAL | 6 refills | Status: AC
  Filled 2022-07-16 (×2): qty 30, 30d supply, fill #0
  Filled 2022-08-08: qty 30, 30d supply, fill #1

## 2022-07-16 NOTE — Telephone Encounter (Signed)
Patient notified in person to check with pharmacy later.  Dm/cma

## 2022-07-16 NOTE — Telephone Encounter (Signed)
Patient having issues with LT foot/toe pain x 3 days. He used to take Allopurinol. Can he get a prescription to the [facility]. Dm/cma

## 2022-07-19 ENCOUNTER — Other Ambulatory Visit (HOSPITAL_COMMUNITY): Payer: Self-pay

## 2022-07-20 ENCOUNTER — Other Ambulatory Visit (HOSPITAL_COMMUNITY): Payer: Self-pay

## 2022-08-07 ENCOUNTER — Telehealth: Payer: Self-pay

## 2022-08-07 ENCOUNTER — Other Ambulatory Visit (HOSPITAL_BASED_OUTPATIENT_CLINIC_OR_DEPARTMENT_OTHER): Payer: Self-pay

## 2022-08-07 DIAGNOSIS — M1000 Idiopathic gout, unspecified site: Secondary | ICD-10-CM

## 2022-08-07 MED ORDER — COLCHICINE 0.6 MG PO TABS
ORAL_TABLET | ORAL | 2 refills | Status: AC
  Filled 2022-08-07: qty 12, 4d supply, fill #0

## 2022-08-07 NOTE — Telephone Encounter (Signed)
Patient would like to get a refill on Cholchine 06 mg sent to the [facility]. Having a gout attack.   Please review and advise.  Thanks. Dm/cma

## 2022-08-07 NOTE — Addendum Note (Signed)
Addended by: Haydee Salter on: 08/07/2022 05:07 PM   Modules accepted: Orders

## 2022-08-09 ENCOUNTER — Other Ambulatory Visit (HOSPITAL_BASED_OUTPATIENT_CLINIC_OR_DEPARTMENT_OTHER): Payer: Self-pay

## 2022-08-28 ENCOUNTER — Encounter: Payer: Self-pay | Admitting: Family Medicine

## 2022-08-28 ENCOUNTER — Ambulatory Visit: Payer: Commercial Managed Care - PPO | Admitting: Family Medicine

## 2022-08-28 VITALS — BP 128/80 | HR 90 | Temp 98.3°F | Ht 71.0 in | Wt 277.2 lb

## 2022-08-28 DIAGNOSIS — E785 Hyperlipidemia, unspecified: Secondary | ICD-10-CM

## 2022-08-28 DIAGNOSIS — E119 Type 2 diabetes mellitus without complications: Secondary | ICD-10-CM | POA: Diagnosis not present

## 2022-08-28 DIAGNOSIS — Z6838 Body mass index (BMI) 38.0-38.9, adult: Secondary | ICD-10-CM | POA: Diagnosis not present

## 2022-08-28 NOTE — Assessment & Plan Note (Signed)
Diabetes has been improving since starting metformin. I will check his A1c today. Plan to continue metformin XR 500 mg daily.

## 2022-08-28 NOTE — Assessment & Plan Note (Signed)
Stable.  Continue atorvastatin 20 mg daily. 

## 2022-08-28 NOTE — Assessment & Plan Note (Signed)
Maximum weight: 294  lbs (01/2022) Current weight: 277 lbs Weight change since last visit: - 7 lbs Total weight loss: - 17 lbs (5.8%)  Encouraged ongoing efforts at weight loss. I encouraged Adam Estrada to add in regular exercise to his daily routine.

## 2022-08-28 NOTE — Progress Notes (Signed)
[facility] [address] [place] [address] Dept: [phone] Dept Fax: [phone]  Chronic Care Office Visit  Subjective:    Patient ID: Arslan Copus., male    DOB: [birth year], 62 y.o..   MRN: [id number]  Chief Complaint  Patient presents with   Medical Management of Chronic Issues    3 month f/u. No concerns.     History of Present Illness:  Patient is in today for reassessment of chronic medical issues.  Mr. Holsapple was diagnosed with Type 2 diabetes in Sept. He is now on metformin XR 500 mg daily. He is tolerating this better now. Mr. Lare feels he is making better food choices and attributes this to the weight loss he has had over the past 3 months. He admits he is not exercising regularly.  Mr. Winey has a history of dyslipidemia. He is managed on atorvastatin 20 mg daily.  Past Medical History: Patient Active Problem List   Diagnosis Date Noted   Dyslipidemia 05/29/2022   History of colon polyps 05/14/2022   Class 2 obesity due to excess calories with body mass index (BMI) of 38.0 to 38.9 in adult 02/21/2022   Gout 02/14/2022   Erectile dysfunction after radical prostatectomy 02/14/2022   Type 2 diabetes mellitus 02/14/2022   History of prostate cancer 06/30/2018   Past Surgical History:  Procedure Laterality Date   ACHILLES TENDON REPAIR     CIRCUMCISION     as an adult    COLONOSCOPY W/ POLYPECTOMY  2016?   LYMPHADENECTOMY Bilateral 06/30/2018   Procedure: LYMPHADENECTOMY, PELVIC;  Surgeon: Raynelle Bring, MD;  Location: [facility];  Service: Urology;  Laterality: Bilateral;   PROSTATE BIOPSY     ROBOT ASSISTED LAPAROSCOPIC RADICAL PROSTATECTOMY N/A 06/30/2018   Procedure: XI ROBOTIC ASSISTED LAPAROSCOPIC RADICAL PROSTATECTOMY LEVEL 2;  Surgeon: Raynelle Bring, MD;  Location: [facility];  Service: Urology;  Laterality: N/A;   VASECTOMY     Family History  Problem Relation Age of Onset   Asthma Mother     Alcohol abuse Father    Pneumonia Father    Heart disease Maternal Aunt    Breast cancer Maternal Aunt    Stroke Maternal Uncle    Heart disease Maternal Grandmother    Colon polyps Maternal Grandfather    Heart disease Maternal Grandfather    Cancer Maternal Grandfather        Prostate   Cancer Paternal Grandfather        Prostate   Heart disease Paternal Grandfather    Heart disease Other    Colon cancer Neg Hx    Esophageal cancer Neg Hx    Rectal cancer Neg Hx    Stomach cancer Neg Hx    Outpatient Medications Prior to Visit  Medication Sig Dispense Refill   allopurinol (ZYLOPRIM) 100 MG tablet Take 1 tablet (100 mg total) by mouth daily. 30 tablet 6   atorvastatin (LIPITOR) 20 MG tablet Take 1 tablet (20 mg total) by mouth daily. 90 tablet 3   colchicine 0.6 MG tablet Take 2 tablets (1.2 mg total) by mouth initially for gout flare, followed by 1 tablet (0.6 mg) after 1 hour. May take 1 tablet (0.6 mg) twice a day for two days until flare improved. 12 tablet 2   metFORMIN (GLUCOPHAGE-XR) 500 MG 24 hr tablet Take 1 tablet (500 mg total) by mouth daily with breakfast. 90 tablet 3   tadalafil (CIALIS) 5 MG tablet Take 1 tablet (5  mg total) by mouth daily. 30 tablet 11   Facility-Administered Medications Prior to Visit  Medication Dose Route Frequency Provider Last Rate Last Admin   0.9 %  sodium chloride infusion  500 mL Intravenous Once Pyrtle, Lajuan Lines, MD       Allergies  Allergen Reactions   Other     NO BLOOD PRODUCTS   Objective:   Today's Vitals   08/28/22 1541  BP: 128/80  Pulse: 90  Temp: 98.3 F (36.8 C)  TempSrc: Temporal  SpO2: 97%  Weight: 277 lb 3.2 oz (125.7 kg)  Height: 5\' 11"  (1.803 m)   Body mass index is 38.66 kg/m.   General: Well developed, well nourished. No acute distress. Psych: Alert and oriented. Normal mood and affect.  Health Maintenance Due  Topic Date Due   HIV Screening  Never done     Assessment & Plan:   Problem List  Items Addressed This Visit       Endocrine   Type 2 diabetes mellitus - Primary    Diabetes has been improving since starting metformin. I will check his A1c today. Plan to continue metformin XR 500 mg daily.      Relevant Orders   Glucose, random   Hemoglobin A1c     Other   Class 2 obesity due to excess calories with body mass index (BMI) of 38.0 to 38.9 in adult    Maximum weight: 294  lbs (01/2022) Current weight: 277 lbs Weight change since last visit: - 7 lbs Total weight loss: - 17 lbs (5.8%)  Encouraged ongoing efforts at weight loss. I encouraged Mr. Ostling to add in regular exercise to his daily routine.      Dyslipidemia    Stable. Continue atorvastatin 20 mg daily.       Return in about 3 months (around 11/27/2022) for Reassessment.   Haydee Salter, MD

## 2022-08-29 LAB — GLUCOSE, RANDOM: Glucose, Bld: 96 mg/dL (ref 70–99)

## 2022-08-29 LAB — HEMOGLOBIN A1C: Hgb A1c MFr Bld: 7.1 % — ABNORMAL HIGH (ref 4.6–6.5)

## 2022-11-27 ENCOUNTER — Ambulatory Visit: Payer: Commercial Managed Care - PPO | Admitting: Family Medicine

## 2023-03-13 ENCOUNTER — Other Ambulatory Visit (HOSPITAL_BASED_OUTPATIENT_CLINIC_OR_DEPARTMENT_OTHER): Payer: Self-pay

## 2023-03-13 MED ORDER — COVID-19 MRNA VAC-TRIS(PFIZER) 30 MCG/0.3ML IM SUSY
0.3000 mL | PREFILLED_SYRINGE | Freq: Once | INTRAMUSCULAR | 0 refills | Status: AC
  Filled 2023-03-13: qty 0.3, 1d supply, fill #0

## 2023-03-13 MED ORDER — FLULAVAL 0.5 ML IM SUSY
0.5000 mL | PREFILLED_SYRINGE | Freq: Once | INTRAMUSCULAR | 0 refills | Status: AC
  Filled 2023-03-13: qty 0.5, 1d supply, fill #0

## 2023-03-14 ENCOUNTER — Other Ambulatory Visit (HOSPITAL_BASED_OUTPATIENT_CLINIC_OR_DEPARTMENT_OTHER): Payer: Self-pay
